# Patient Record
Sex: Male | Born: 1995 | Race: Black or African American | Hispanic: No | Marital: Single | Smoking: Never smoker
Health system: Southern US, Community
[De-identification: ages and names within clinical notes are randomized; demographics above are authoritative.]

## PROBLEM LIST (undated history)

## (undated) DIAGNOSIS — G441 Vascular headache, not elsewhere classified: Secondary | ICD-10-CM

## (undated) DIAGNOSIS — N62 Hypertrophy of breast: Secondary | ICD-10-CM

## (undated) DIAGNOSIS — G43109 Migraine with aura, not intractable, without status migrainosus: Secondary | ICD-10-CM

## (undated) DIAGNOSIS — J309 Allergic rhinitis, unspecified: Secondary | ICD-10-CM

## (undated) DIAGNOSIS — K219 Gastro-esophageal reflux disease without esophagitis: Secondary | ICD-10-CM

## (undated) HISTORY — PX: WISDOM TOOTH EXTRACTION: SHX21

## (undated) HISTORY — DX: Gastro-esophageal reflux disease without esophagitis: K21.9

## (undated) HISTORY — DX: Vascular headache, not elsewhere classified: G44.1

## (undated) HISTORY — DX: Allergic rhinitis, unspecified: J30.9

## (undated) HISTORY — DX: Migraine with aura, not intractable, without status migrainosus: G43.109

## (undated) HISTORY — DX: Hypertrophy of breast: N62

---
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
## 2013-11-20 ENCOUNTER — Other Ambulatory Visit: Payer: Self-pay | Admitting: Physician Assistant

## 2013-11-20 ENCOUNTER — Ambulatory Visit (INDEPENDENT_AMBULATORY_CARE_PROVIDER_SITE_OTHER): Payer: BC Managed Care – PPO | Admitting: Physician Assistant

## 2013-11-20 ENCOUNTER — Encounter: Payer: Self-pay | Admitting: Physician Assistant

## 2013-11-20 VITALS — BP 104/74 | HR 72 | Temp 97.8°F | Resp 18 | Wt 154.0 lb

## 2013-11-20 DIAGNOSIS — Z202 Contact with and (suspected) exposure to infections with a predominantly sexual mode of transmission: Secondary | ICD-10-CM

## 2013-11-20 DIAGNOSIS — R300 Dysuria: Secondary | ICD-10-CM

## 2013-11-20 NOTE — Progress Notes (Signed)
Pre visit review using our clinic review tool, if applicable. No additional management support is needed unless otherwise documented below in the visit note. 

## 2013-11-21 ENCOUNTER — Encounter: Payer: Self-pay | Admitting: Physician Assistant

## 2013-11-21 DIAGNOSIS — Z202 Contact with and (suspected) exposure to infections with a predominantly sexual mode of transmission: Secondary | ICD-10-CM

## 2013-11-21 DIAGNOSIS — R300 Dysuria: Secondary | ICD-10-CM

## 2013-11-21 MED ORDER — AZITHROMYCIN 250 MG PO TABS: 1000.0000 mg | ORAL_TABLET | Freq: Once | ORAL | Status: DC

## 2013-11-21 MED ORDER — CEFTRIAXONE SODIUM 1 G IJ SOLR: 250.0000 mg | Freq: Once | INTRAMUSCULAR | Status: DC

## 2013-11-21 MED ORDER — CEFTRIAXONE SODIUM 500 MG IJ SOLR
250.0000 mg | Freq: Once | INTRAMUSCULAR | Status: AC
  Administered 2013-11-21: 250 mg via INTRAMUSCULAR

## 2013-11-21 NOTE — Progress Notes (Signed)
Subjective:    Patient ID: Brandon Buckley, male    DOB: Mar 20, 1996, 18 y.o.   MRN: [id number]  HPI Patient is a 18 year old African American male presenting to the clinic for a possible urinary tract infection. Patient states that for the past 3 days he is still an itchy sensation inside his urethra, felt the urge to pee, increased frequency of having to urinate. He denies dysuria, discharge, testicular tenderness, perineal tenderness, flank pain, abdominal pain. He is sexually active with multiple partners, and does not use protection, however does not believe he has acquired an STD. He denies abnormal coloration of the urine, blood in his urine, foul smell to his urine. He denies fevers, chills, nausea, vomiting, diarrhea, shortness of breath. He has no history of UTIs, STDs, or urological procedures.   Review of Systems As per history of present illness and are otherwise negative.   History reviewed. No pertinent past medical history.  History   Social History  . Marital Status: Single    Spouse Name: N/A    Number of Children: N/A  . Years of Education: N/A   Occupational History  . Not on file.   Social History Main Topics  . Smoking status: Never Smoker   . Smokeless tobacco: Never Used  . Alcohol Use: No  . Drug Use: Not on file  . Sexual Activity: Not on file   Other Topics Concern  . Not on file   Social History Narrative   hh of 5    3 dogs    No ets.      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    History reviewed. No pertinent past surgical history.  Family History  Problem Relation Age of Onset  . Hypertension      both grandparents  . Diabetes type II      both grandparents.   . Stroke      ggm  . Migraines Mother     No Known Allergies  No current outpatient prescriptions on file prior to visit.   No current facility-administered medications on file prior to visit.    EXAM: BP  104/74  Pulse 72  Temp(Src) 97.8 F (36.6 C) (Oral)  Resp 18  Wt 154 lb (69.854 kg)      Objective:   Physical Exam  Nursing note and vitals reviewed. Constitutional: He is oriented to person, place, and time. He appears well-developed and well-nourished. No distress.  HENT:  Head: Normocephalic and atraumatic.  Eyes: Conjunctivae and EOM are normal. Pupils are equal, round, and reactive to light.  Neck: Normal range of motion. Neck supple.  Cardiovascular: Normal rate, regular rhythm, normal heart sounds and intact distal pulses.   Pulmonary/Chest: Effort normal and breath sounds normal. He exhibits no tenderness.  No cva tenderness.  Genitourinary: Penis normal. No penile tenderness.  Musculoskeletal: Normal range of motion.  Neurological: He is alert and oriented to person, place, and time.  Skin: Skin is warm and dry. No rash noted. He is not diaphoretic. No erythema. No pallor.  Psychiatric: He has a normal mood and affect. His behavior is normal. Judgment and thought content normal.   No results found for this basename: WBC, HGB, HCT, PLT, GLUCOSE, CHOL, TRIG, HDL, LDLDIRECT, LDLCALC, ALT, AST, NA, K, CL, CREATININE, BUN, CO2, TSH, PSA, INR, GLUF, HGBA1C, MICROALBUR   UA dipstick - small Leukocytes.  Assessment & Plan:  Brandon Buckley was seen today for cystitis.  Diagnoses and associated orders for this visit:  Dysuria Comments: Small leukocytes on UA. Will culture urine. - POCT urinalysis dipstick - Culture, Urine - GC/chlamydia probe amp, urine - cefTRIAXone (ROCEPHIN) injection 250 mg; Inject 0.25 g (250 mg total) into the muscle once.  Possible exposure to STD Comments: Pt unsure, no history of UTIs. Will have urine tested for gonorrhea and Chlamydia. Empirically treat with 1 g of azithromycin and 250 mg IM injection Rocephin. - Culture, Urine - GC/chlamydia probe amp, urine - azithromycin (ZITHROMAX) 250 MG tablet; Take 4 tablets (1,000 mg total) by mouth once. 2  tablets once daily for 3 consecutive days - cefTRIAXone (ROCEPHIN) injection 250 mg; Inject 0.25 g (250 mg total) into the muscle once.   Patient empirically treated for possible STD exposure with 1 g of azithromycin hand-written prescription (power loss in building), and 250 mg IM injection of Rocephin. Urine is being cultured and tested for gonorrhea and Chlamydia. No HIV reflex as patient presents with lack of exposure to HIV, and only recently developed urinary symptoms, and is otherwise asymptomatic.  Return precautions provided  Plan to follow up as needed, or for worsening or persistent symptoms despite treatment.  Patient Instructions  We will call you with your lab results when they are available.  Azithromycin, take 4 tablets all at once with a full glass of water.  Call to let office know if you can tolerate the medication, or if you vomit after taking it, as this could indicate that you were not treated.  Followup as needed, or for worsening or persistent symptoms despite treatment.

## 2013-11-21 NOTE — Patient Instructions (Addendum)
We will call you with your lab results when they are available.  Azithromycin, take 4 tablets all at once with a full glass of water.  Call to let office know if you can tolerate the medication, or if you vomit after taking it, as this could indicate that you were not treated.  Followup as needed, or for worsening or persistent symptoms despite treatment.

## 2013-11-21 NOTE — Addendum Note (Signed)
Addended by: Azucena FreedMILLNER, Jahvier Aldea C on: 11/21/2013 08:22 AM   Modules accepted: Orders

## 2013-11-22 LAB — URINE CULTURE
Colony Count: NO GROWTH
Organism ID, Bacteria: NO GROWTH

## 2013-11-23 LAB — GC/CHLAMYDIA PROBE AMP
CT Probe RNA: POSITIVE — AB
GC PROBE AMP APTIMA: NEGATIVE

## 2013-11-26 ENCOUNTER — Telehealth: Payer: Self-pay

## 2013-11-26 NOTE — Telephone Encounter (Signed)
Called and advised pt to make sure partners are aware so they can be treated.

## 2013-11-26 NOTE — Telephone Encounter (Signed)
Called and spoke with pt and pt is aware.  Pt states he was active with another partner last night.  Advised pt to make them aware of diagnosis. Is there anything else I need to make pt aware of?

## 2013-11-26 NOTE — Telephone Encounter (Signed)
Received a fax from [facility] of pt's results. There was no growth on the urine culture.  Pt tested positive for chlamydia.  Pt was treated in the office with rocephin 250 mg and azithromycin was sent to the pharmacy.  Pt should not be sexually active for at least 1 week and notify all partners of diagnosis.   Ok per Zion[place]onda to call patient and advise of results. Left a message for pt to return call.

## 2013-11-26 NOTE — Telephone Encounter (Signed)
Just be sure Health Dept has been notified.

## 2014-03-04 ENCOUNTER — Encounter: Payer: Self-pay | Admitting: Internal Medicine

## 2014-06-09 ENCOUNTER — Ambulatory Visit (INDEPENDENT_AMBULATORY_CARE_PROVIDER_SITE_OTHER): Payer: BLUE CROSS/BLUE SHIELD | Admitting: Internal Medicine

## 2014-06-09 ENCOUNTER — Encounter: Payer: Self-pay | Admitting: Internal Medicine

## 2014-06-09 VITALS — BP 142/82 | Temp 98.0°F | Ht 70.0 in | Wt 152.4 lb

## 2014-06-09 DIAGNOSIS — M545 Low back pain, unspecified: Secondary | ICD-10-CM

## 2014-06-09 DIAGNOSIS — R030 Elevated blood-pressure reading, without diagnosis of hypertension: Secondary | ICD-10-CM

## 2014-06-09 DIAGNOSIS — Z299 Encounter for prophylactic measures, unspecified: Secondary | ICD-10-CM

## 2014-06-09 DIAGNOSIS — Z113 Encounter for screening for infections with a predominantly sexual mode of transmission: Secondary | ICD-10-CM

## 2014-06-09 DIAGNOSIS — Z418 Encounter for other procedures for purposes other than remedying health state: Secondary | ICD-10-CM

## 2014-06-09 LAB — POCT URINALYSIS DIPSTICK
Glucose, UA: NEGATIVE
Ketones, UA: NEGATIVE
Leukocytes, UA: NEGATIVE
Nitrite, UA: NEGATIVE
Spec Grav, UA: 1.025
UROBILINOGEN UA: 1
pH, UA: 7

## 2014-06-09 NOTE — Patient Instructions (Signed)
Back pain seems like mechanichal back pain   Back exercises and if  persistent or progressive would advise seeing sports medicine .  Advise blood test as discussed to include cholesterol blood sugar anemia check HIV   Etc .   Back Exercises Back exercises help treat and prevent back injuries. The goal of back exercises is to increase the strength of your abdominal and back muscles and the flexibility of your back. These exercises should be started when you no longer have back pain. Back exercises include:  Pelvic Tilt. Lie on your back with your knees bent. Tilt your pelvis until the lower part of your back is against the floor. Hold this position 5 to 10 sec and repeat 5 to 10 times.  Knee to Chest. Pull first 1 knee up against your chest and hold for 20 to 30 seconds, repeat this with the other knee, and then both knees. This may be done with the other leg straight or bent, whichever feels better.  Sit-Ups or Curl-Ups. Bend your knees 90 degrees. Start with tilting your pelvis, and do a partial, slow sit-up, lifting your trunk only 30 to 45 degrees off the floor. Take at least 2 to 3 seconds for each sit-up. Do not do sit-ups with your knees out straight. If partial sit-ups are difficult, simply do the above but with only tightening your abdominal muscles and holding it as directed.  Hip-Lift. Lie on your back with your knees flexed 90 degrees. Push down with your feet and shoulders as you raise your hips a couple inches off the floor; hold for 10 seconds, repeat 5 to 10 times.  Back arches. Lie on your stomach, propping yourself up on bent elbows. Slowly press on your hands, causing an arch in your low back. Repeat 3 to 5 times. Any initial stiffness and discomfort should lessen with repetition over time.  Shoulder-Lifts. Lie face down with arms beside your body. Keep hips and torso pressed to floor as you slowly lift your head and shoulders off the floor. Do not overdo your exercises,  especially in the beginning. Exercises may cause you some mild back discomfort which lasts for a few minutes; however, if the pain is more severe, or lasts for more than 15 minutes, do not continue exercises until you see your caregiver. Improvement with exercise therapy for back problems is slow.  See your caregivers for assistance with developing a proper back exercise program. Document Released: 06/30/2004 Document Revised: 08/15/2011 Document Reviewed: 03/24/2011 [facility] Patient Information 2015 [place], [place]. This information is not intended to replace advice given to you by your health care provider. Make sure you discuss any questions you have with your health care provider.

## 2014-06-09 NOTE — Progress Notes (Signed)
Pre visit review using our clinic review tool, if applicable. No additional management support is needed unless otherwise documented below in the visit note.  Chief Complaint  Patient presents with  . Lower Back Pain    On and off since June.    HPI: Patient Brandon Buckley  comes in today for SDA for  new problem evaluation. Last seem by me 3 years ago  And PA last year and had chlamydia  With lbp. lbp  Middle  And off and on  4 months no injury  Worse with sitting too long and  Night.   Every day sharp at times   Certain motions bend over.     No meds    And no dysuria  Otherwise  .  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 No remote hx  No hx of ht  No gi sx  No fever chills resp sx  No rd weekend  Etoh.  No past medical history on file.  Family History  Problem Relation Age of Onset  . Hypertension      both grandparents  . Diabetes type II      both grandparents.   . Stroke      ggm  . Migraines Mother     History   Social History  . Marital Status: Single    Spouse Name: N/A    Number of Children: N/A  . Years of Education: N/A   Social History Main Topics  . Smoking status: Never Smoker   . Smokeless tobacco: Never Used  . Alcohol Use: No  . Drug Use: None  . Sexual Activity: None   Other Topics Concern  . None   Social History Narrative   hh of 5    3 dogs    No ets.      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    Outpatient Encounter Prescriptions as of 06/09/2014  Medication Sig  . [DISCONTINUED] azithromycin (ZITHROMAX) 250 MG tablet Take 4 tablets (1,000 mg total) by mouth once. 2 tablets once daily for 3 consecutive days    EXAM:  BP 142/82 mmHg  Temp(Src) 98 F (36.7 C) (Oral)  Ht  (1.778 m)  Wt 152 lb 6.4 oz (69.128 kg)  BMI 21.87 kg/m2  Body mass index is 21.87 kg/(m^2).  GENERAL: vitals reviewed and listed above,  alert, oriented, appears well hydrated and in no acute distress HEENT: atraumatic, conjunctiva  clear, no obvious abnormalities on inspection of external nose and ears OP : no lesion edema or exudate  NECK: no obvious masses on inspection palpation  LUNGS: clear to auscultation bilaterally, no wheezes, rales or rhonchi, good air movement CV: HRRR, no clubbing cyanosis or  peripheral edema nl cap refill  Abdomen:  Sof,t normal bowel sounds without hepatosplenomegaly, no guarding rebound or masses no CVA tenderness Back no scoilosis and no focal pain points to mid lower back ls area  Neg lsr no weakness  no rash.  MS: moves all extremities without noticeable focal  abnormality PSYCH: pleasant and cooperative, no obvious depression or anxiety  ASSESSMENT AND PLAN:  Discussed the following assessment and plan:  Midline low back pain without sciatica - seems postural no alarm  sx except young age  - Plan: GC/chlamydia probe amp, urine, POCT urinalysis dipstick, Basic metabolic panel,  CBC with Differential, Hepatic function panel, Lipid panel, TSH, HIV antibody, RPR  Routine screening for STI (sexually transmitted infection) - hx chl  never had  hiv rpr  advise this he wants to check on cost first - Plan: GC/chlamydia probe amp, urine, POCT urinalysis dipstick, Basic metabolic panel, CBC with Differential, Hepatic function panel, Lipid panel, TSH, HIV antibody, RPR  Elevated blood pressure reading - 142/84  repeat to ensure normal x 3  if not recheck pt says had stress before coming in   Preventive measure - Plan: Basic metabolic panel, CBC with Differential, Hepatic function panel, Lipid panel, TSH, HIV antibody, RPR Hx of chlamydia  And rx  Some type of back pain then but doesn't. sound like this.    Wants to wait on blood test will ask parents about cost etc can make appt any time for this    Need to ensure BP is not  Consistently elevated -Patient advised to return or notify health care team  if  symptoms worsen ,persist or new concerns arise.  Ho given x 2  further eval if  persistent or progressive   Patient Instructions  Back pain seems like mechanichal back pain   Back exercises and if  persistent or progressive would advise seeing sports medicine .  Advise blood test as discussed to include cholesterol blood sugar anemia check HIV   Etc .   Back Exercises Back exercises help treat and prevent back injuries. The goal of back exercises is to increase the strength of your abdominal and back muscles and the flexibility of your back. These exercises should be started when you no longer have back pain. Back exercises include:  Pelvic Tilt. Lie on your back with your knees bent. Tilt your pelvis until the lower part of your back is against the floor. Hold this position 5 to 10 sec and repeat 5 to 10 times.  Knee to Chest. Pull first 1 knee up against your chest and hold for 20 to 30 seconds, repeat this with the other knee, and then both knees. This may be done with the other leg straight or bent, whichever feels better.  Sit-Ups or Curl-Ups. Bend your knees 90 degrees. Start with tilting your pelvis, and do a partial, slow sit-up, lifting your trunk only 30 to 45 degrees off the floor. Take at least 2 to 3 seconds for each sit-up. Do not do sit-ups with your knees out straight. If partial sit-ups are difficult, simply do the above but with only tightening your abdominal muscles and holding it as directed.  Hip-Lift. Lie on your back with your knees flexed 90 degrees. Push down with your feet and shoulders as you raise your hips a couple inches off the floor; hold for 10 seconds, repeat 5 to 10 times.  Back arches. Lie on your stomach, propping yourself up on bent elbows. Slowly press on your hands, causing an arch in your low back. Repeat 3 to 5 times. Any initial stiffness and discomfort should lessen with repetition over time.  Shoulder-Lifts. Lie face down with arms beside your  body. Keep hips and torso pressed to floor as you slowly lift your head and shoulders off the floor. Do not overdo your exercises, especially in the beginning. Exercises may cause you some mild back discomfort which lasts for a few minutes; however, if the pain is more severe, or lasts for more than 15 minutes, do not continue exercises until you see your caregiver. Improvement with exercise therapy for  back problems is slow.  See your caregivers for assistance with developing a proper back exercise program. Document Released: 06/30/2004 Document Revised: 08/15/2011 Document Reviewed: 03/24/2011 [facility] Patient Information 2015 [place], [place]. This information is not intended to replace advice given to you by your health care provider. Make sure you discuss any questions you have with your health care provider.      Neta Mends. Aeric Burnham M.D. Total visit > 50% spent counseling and coordinating care

## 2014-06-14 LAB — GC/CHLAMYDIA PROBE AMP, URINE
Chlamydia, Swab/Urine, PCR: NEGATIVE
GC PROBE AMP, URINE: NEGATIVE

## 2014-07-15 ENCOUNTER — Ambulatory Visit (INDEPENDENT_AMBULATORY_CARE_PROVIDER_SITE_OTHER): Payer: BLUE CROSS/BLUE SHIELD | Admitting: Internal Medicine

## 2014-07-15 ENCOUNTER — Encounter: Payer: Self-pay | Admitting: Internal Medicine

## 2014-07-15 VITALS — BP 158/80 | Temp 98.0°F | Ht 70.0 in | Wt 154.7 lb

## 2014-07-15 DIAGNOSIS — M545 Low back pain, unspecified: Secondary | ICD-10-CM

## 2014-07-15 DIAGNOSIS — Z418 Encounter for other procedures for purposes other than remedying health state: Secondary | ICD-10-CM

## 2014-07-15 DIAGNOSIS — R030 Elevated blood-pressure reading, without diagnosis of hypertension: Secondary | ICD-10-CM

## 2014-07-15 DIAGNOSIS — Z299 Encounter for prophylactic measures, unspecified: Secondary | ICD-10-CM

## 2014-07-15 LAB — POCT URINALYSIS DIP (MANUAL ENTRY)
Bilirubin, UA: NEGATIVE
Blood, UA: NEGATIVE
GLUCOSE UA: NEGATIVE
Leukocytes, UA: NEGATIVE
NITRITE UA: NEGATIVE
PROTEIN UA: NEGATIVE
Spec Grav, UA: 1.01
Urobilinogen, UA: 0.2
pH, UA: 6

## 2014-07-15 LAB — HEPATIC FUNCTION PANEL
ALBUMIN: 4.4 g/dL (ref 3.5–5.2)
ALK PHOS: 76 U/L (ref 39–117)
ALT: 8 U/L (ref 0–53)
AST: 15 U/L (ref 0–37)
Bilirubin, Direct: 0.3 mg/dL (ref 0.0–0.3)
Total Bilirubin: 1.4 mg/dL — ABNORMAL HIGH (ref 0.2–1.2)
Total Protein: 7.1 g/dL (ref 6.0–8.3)

## 2014-07-15 LAB — BASIC METABOLIC PANEL
BUN: 11 mg/dL (ref 6–23)
CALCIUM: 9.6 mg/dL (ref 8.4–10.5)
CO2: 29 mEq/L (ref 19–32)
CREATININE: 0.8 mg/dL (ref 0.40–1.50)
Chloride: 103 mEq/L (ref 96–112)
GFR: 157.25 mL/min (ref >60.00)
Glucose, Bld: 89 mg/dL (ref 70–99)
Potassium: 4.2 mEq/L (ref 3.5–5.1)
SODIUM: 137 meq/L (ref 135–145)

## 2014-07-15 LAB — CBC WITH DIFFERENTIAL/PLATELET
Basophils Absolute: 0 10*3/uL (ref 0.0–0.1)
Basophils Relative: 0.5 % (ref 0.0–3.0)
Eosinophils Absolute: 0.1 10*3/uL (ref 0.0–0.7)
Eosinophils Relative: 1.3 % (ref 0.0–5.0)
HCT: 42.9 % (ref 39.0–52.0)
Hemoglobin: 14.8 g/dL (ref 13.0–17.0)
LYMPHS PCT: 36.6 % (ref 12.0–46.0)
Lymphs Abs: 2.2 10*3/uL (ref 0.7–4.0)
MCHC: 34.5 g/dL (ref 30.0–36.0)
MCV: 93.4 fl (ref 78.0–100.0)
MONOS PCT: 5.6 % (ref 3.0–12.0)
Monocytes Absolute: 0.3 10*3/uL (ref 0.1–1.0)
NEUTROS ABS: 3.3 10*3/uL (ref 1.4–7.7)
Neutrophils Relative %: 56 % (ref 43.0–77.0)
Platelets: 274 10*3/uL (ref 150.0–400.0)
RBC: 4.59 Mil/uL (ref 4.22–5.81)
RDW: 13.1 % (ref 11.5–14.6)
WBC: 6 10*3/uL (ref 4.5–10.5)

## 2014-07-15 LAB — LIPID PANEL
CHOL/HDL RATIO: 2
CHOLESTEROL: 144 mg/dL (ref 0–200)
HDL: 65.4 mg/dL (ref >39.00)
LDL CALC: 67 mg/dL (ref 0–99)
NonHDL: 78.6
TRIGLYCERIDES: 58 mg/dL (ref 0.0–149.0)
VLDL: 11.6 mg/dL (ref 0.0–40.0)

## 2014-07-15 LAB — C-REACTIVE PROTEIN

## 2014-07-15 LAB — TSH: TSH: 0.88 u[IU]/mL (ref 0.35–5.50)

## 2014-07-15 LAB — SEDIMENTATION RATE: Sed Rate: 6 mm/hr (ref 0–22)

## 2014-07-15 NOTE — Progress Notes (Signed)
Pre visit review using our clinic review tool, if applicable. No additional management support is needed unless otherwise documented below in the visit note.  Chief Complaint  Patient presents with  . Follow-up    basck pain persists    HPI: Brandon Buckley comes in cause back pain not better very localized but sig pain when tries to exercise jump run or other and when gets up from sitting . No fever gu sx . No sti sx  No tobacco sig etoh except super bowl sunday  fam mom  Med for bp  No cv pulm sx . ROS: See pertinent positives and negatives per HPI. No cp sob   Neg rx except MJ   Past Medical History  Diagnosis Date  . 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 Vascular headache 10/11/2011  . ALLERGIC RHINITIS 03/06/2009    Qualifier: Diagnosis of  By: Clent Ridges MD, Tera Mater   . GYNECOMASTIA 03/06/2009    Qualifier: Diagnosis of  By: Clent Ridges MD, Tera Mater     Family History  Problem Relation Age of Onset  . Hypertension      both grandparents  . Diabetes type II      both grandparents.   . Stroke      ggm  . Migraines Mother     History   Social History  . Marital Status: Single    Spouse Name: N/A    Number of Children: N/A  . Years of Education: N/A   Social History Main Topics  . Smoking status: Never Smoker   . Smokeless tobacco: Never Used  . Alcohol Use: No  . Drug Use: None  . Sexual Activity: None   Other Topics Concern  . None   Social History Narrative   hh of 5    3 dogs    No ets.      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    EXAM:  BP 158/80 mmHg  Temp(Src) 98 F (36.7 C) (Oral)  Ht  (1.778 m)  Wt 154 lb 11.2 oz (70.171 kg)  BMI 22.20 kg/m2  Body mass index is 22.2 kg/(m^2).  GENERAL: vitals reviewed and listed above, alert, oriented, appears well hydrated and in no acute distress HEENT: atraumatic, conjunctiva  clear, no  obvious abnormalities on inspection of external nose and ears  NECK: no obvious masses on inspection palpation  CV: HRRR, no clubbing cyanosis or  peripheral edema nl cap refill  Nl rom back  tender  Mid sacral lumbar area as area of  Discomfort  No lesions MS: moves all extremities without noticeable focal  abnormality PSYCH: pleasant and cooperative, no obvious depression or anxiety Lab Results  Component Value Date   WBC 6.0 07/15/2014   HGB 14.8 07/15/2014   HCT 42.9 07/15/2014   PLT 274.0 07/15/2014   GLUCOSE 89 07/15/2014   CHOL 144 07/15/2014   TRIG 58.0 07/15/2014   HDL 65.40 07/15/2014   LDLCALC 67 07/15/2014   ALT 8 07/15/2014   AST 15 07/15/2014   NA 137 07/15/2014   K 4.2 07/15/2014   CL 103 07/15/2014   CREATININE 0.80 07/15/2014   BUN 11 07/15/2014   CO2 29 07/15/2014   TSH 0.88 07/15/2014    ASSESSMENT AND PLAN:  Discussed the following assessment and plan:  Midline low  back pain without sciatica - persisten local interfereing with ability to run or exercise refer to sm more evaluation imaging as indicated  - Plan: POCT urinalysis dipstick, Basic metabolic panel, CBC with Differential/Platelet, Hepatic function panel, Lipid panel, TSH, HIV antibody, C-reactive protein, Sedimentation rate, Ambulatory referral to Sports Medicine  Elevated blood pressure reading - concern about level of elevation needs fu to ensure control or rx  rov in 1 month ORsend in readings /lab today - Plan: POCT urinalysis dipstick, Basic metabolic panel, CBC with Differential/Platelet, Hepatic function panel, Lipid panel, TSH, HIV antibody, C-reactive protein, Sedimentation rate  Preventive measure - labs and screening since blood draw today - Plan: POCT urinalysis dipstick, Basic metabolic panel, CBC with Differential/Platelet, Hepatic function panel, Lipid panel, TSH, HIV antibody, C-reactive protein, Sedimentation rate BP Readings from Last 3 Encounters:  07/15/14 158/80  06/09/14 142/82   11/20/13 104/74   Total visit > 50% spent counseling and coordinating care   -Patient advised to return or notify health care team  if symptoms worsen ,persist or new concerns arise.  Patient Instructions   Plan referral to sports medicine for  your back pain .  You will need x ray of the area  Change appt time if need to .  Will notify you  of labs when available. Checking for kidney dysfunction inflammation and routine screening . Your BP is quite high for your age group .  Again could be from  Stress of visit but  Need to document  Over time .  Hypertension is 3 separate readings elevated  for age .  Yoiurs should be in the 120/80 range . If doesn't come down plan treatment  .    Hypertension Hypertension, commonly called high blood pressure, is when the force of blood pumping through your arteries is too strong. Your arteries are the blood vessels that carry blood from your heart throughout your body. A blood pressure reading consists of a higher number over a lower number, such as 110/72. The higher number (systolic) is the pressure inside your arteries when your heart pumps. The lower number (diastolic) is the pressure inside your arteries when your heart relaxes. Ideally you want your blood pressure below 120/80. Hypertension forces your heart to work harder to pump blood. Your arteries may become narrow or stiff. Having hypertension puts you at risk for heart disease, stroke, and other problems.  RISK FACTORS Some risk factors for high blood pressure are controllable. Others are not.  Risk factors you cannot control include:   Race. You may be at higher risk if you are African American.  Age. Risk increases with age.  Gender. Men are at higher risk than women before age 73 years. After age 24, women are at higher risk than men. Risk factors you can control include:  Not getting enough exercise or physical activity.  Being overweight.  Getting too much fat, sugar,  calories, or salt in your diet.  Drinking too much alcohol. SIGNS AND SYMPTOMS Hypertension does not usually cause signs or symptoms. Extremely high blood pressure (hypertensive crisis) may cause headache, anxiety, shortness of breath, and nosebleed. DIAGNOSIS  To check if you have hypertension, your health care provider will measure your blood pressure while you are seated, with your arm held at the level of your heart. It should be measured at least twice using the same arm. Certain conditions can cause a difference in blood pressure between your right and left arms. A blood pressure reading that is  higher than normal on one occasion does not mean that you need treatment. If one blood pressure reading is high, ask your health care provider about having it checked again. TREATMENT  Treating high blood pressure includes making lifestyle changes and possibly taking medicine. Living a healthy lifestyle can help lower high blood pressure. You may need to change some of your habits. Lifestyle changes may include:  Following the DASH diet. This diet is high in fruits, vegetables, and whole grains. It is low in salt, red meat, and added sugars.  Getting at least 2 hours of brisk physical activity every week.  Losing weight if necessary.  Not smoking.  Limiting alcoholic beverages.  Learning ways to reduce stress. If lifestyle changes are not enough to get your blood pressure under control, your health care provider may prescribe medicine. You may need to take more than one. Work closely with your health care provider to understand the risks and benefits. HOME CARE INSTRUCTIONS  Have your blood pressure rechecked as directed by your health care provider.   Take medicines only as directed by your health care provider. Follow the directions carefully. Blood pressure medicines must be taken as prescribed. The medicine does not work as well when you skip doses. Skipping doses also puts you at risk  for problems.   Do not smoke.   Monitor your blood pressure at home as directed by your health care provider. SEEK MEDICAL CARE IF:   You think you are having a reaction to medicines taken.  You have recurrent headaches or feel dizzy.  You have swelling in your ankles.  You have trouble with your vision. SEEK IMMEDIATE MEDICAL CARE IF:  You develop a severe headache or confusion.  You have unusual weakness, numbness, or feel faint.  You have severe chest or abdominal pain.  You vomit repeatedly.  You have trouble breathing. MAKE SURE YOU:   Understand these instructions.  Will watch your condition.  Will get help right away if you are not doing well or get worse. Document Released: 05/23/2005 Document Revised: 10/07/2013 Document Reviewed: 03/15/2013 [facility] Patient Information 2015 [place], [place]. This information is not intended to replace advice given to you by your health care provider. Make sure you discuss any questions you have with your health care provider. How to Take Your Blood Pressure HOW DO I GET A BLOOD PRESSURE MACHINE?  You can buy an electronic home blood pressure machine at your local pharmacy. Insurance will sometimes cover the cost if you have a prescription.  Ask your doctor what type of machine is best for you. There are different machines for your arm and your wrist.  If you decide to buy a machine to check your blood pressure on your arm, first check the size of your arm so you can buy the right size cuff. To check the size of your arm:   Use a measuring tape that shows both inches and centimeters.   Wrap the measuring tape around the upper-middle part of your arm. You may need someone to help you measure.   Write down your arm measurement in both inches and centimeters.   To measure your blood pressure correctly, it is important to have the right size cuff.   If your arm is up to 13 inches (up to 34 centimeters), get an adult cuff  size.  If your arm is 13 to 17 inches (35 to 44 centimeters), get a large adult cuff size.    If your arm is  17 to 20 inches (45 to 52 centimeters), get an adult thigh cuff.  WHAT DO THE NUMBERS MEAN?   There are two numbers that make up your blood pressure. For example: 120/80.  The first number (120 in our example) is called the "systolic pressure." It is a measure of the pressure in your blood vessels when your heart is pumping blood.  The second number (80 in our example) is called the "diastolic pressure." It is a measure of the pressure in your blood vessels when your heart is resting between beats.  Your doctor will tell you what your blood pressure should be. WHAT SHOULD I DO BEFORE I CHECK MY BLOOD PRESSURE?   Try to rest or relax for at least 30 minutes before you check your blood pressure.  Do not smoke.  Do not have any drinks with caffeine, such as:  Soda.  Coffee.  Tea.  Check your blood pressure in a quiet room.  Sit down and stretch out your arm on a table. Keep your arm at about the level of your heart. Let your arm relax.  Make sure that your legs are not crossed. HOW DO I CHECK MY BLOOD PRESSURE?  Follow the directions that came with your machine.  Make sure you remove any tight-fighting clothing from your arm or wrist. Wrap the cuff around your upper arm or wrist. You should be able to fit a finger between the cuff and your arm. If you cannot fit a finger between the cuff and your arm, it is too tight and should be removed and rewrapped.  Some units require you to manually pump up the arm cuff.  Automatic units inflate the cuff when you press a button.  Cuff deflation is automatic in both models.  After the cuff is inflated, the unit measures your blood pressure and pulse. The readings are shown on a monitor. Hold still and breathe normally while the cuff is inflated.  Getting a reading takes less than a minute.  Some models store readings in a  memory. Some provide a printout of readings. If your machine does not store your readings, keep a written record.  Take readings with you to your next visit with your doctor. Document Released: 05/05/2008 Document Revised: 10/07/2013 Document Reviewed: 07/18/2013 [facility] Patient Information 2015 [place], [place]. This information is not intended to replace advice given to you by your health care provider. Make sure you discuss any questions you have with your health care provider.        Neta MendsWanda K. Nazirah Tri M.D.

## 2014-07-15 NOTE — Patient Instructions (Addendum)
Plan referral to sports medicine for  your back pain .  You will need x ray of the area  Change appt time if need to .  Will notify you  of labs when available. Checking for kidney dysfunction inflammation and routine screening . Your BP is quite high for your age group .  Again could be from  Stress of visit but  Need to document  Over time .  Hypertension is 3 separate readings elevated  for age .  Yoiurs should be in the 120/80 range . If doesn't come down plan treatment  .    Hypertension Hypertension, commonly called high blood pressure, is when the force of blood pumping through your arteries is too strong. Your arteries are the blood vessels that carry blood from your heart throughout your body. A blood pressure reading consists of a higher number over a lower number, such as 110/72. The higher number (systolic) is the pressure inside your arteries when your heart pumps. The lower number (diastolic) is the pressure inside your arteries when your heart relaxes. Ideally you want your blood pressure below 120/80. Hypertension forces your heart to work harder to pump blood. Your arteries may become narrow or stiff. Having hypertension puts you at risk for heart disease, stroke, and other problems.  RISK FACTORS Some risk factors for high blood pressure are controllable. Others are not.  Risk factors you cannot control include:   Race. You may be at higher risk if you are African American.  Age. Risk increases with age.  Gender. Men are at higher risk than women before age 91 years. After age 25, women are at higher risk than men. Risk factors you can control include:  Not getting enough exercise or physical activity.  Being overweight.  Getting too much fat, sugar, calories, or salt in your diet.  Drinking too much alcohol. SIGNS AND SYMPTOMS Hypertension does not usually cause signs or symptoms. Extremely high blood pressure (hypertensive crisis) may cause headache, anxiety,  shortness of breath, and nosebleed. DIAGNOSIS  To check if you have hypertension, your health care provider will measure your blood pressure while you are seated, with your arm held at the level of your heart. It should be measured at least twice using the same arm. Certain conditions can cause a difference in blood pressure between your right and left arms. A blood pressure reading that is higher than normal on one occasion does not mean that you need treatment. If one blood pressure reading is high, ask your health care provider about having it checked again. TREATMENT  Treating high blood pressure includes making lifestyle changes and possibly taking medicine. Living a healthy lifestyle can help lower high blood pressure. You may need to change some of your habits. Lifestyle changes may include:  Following the DASH diet. This diet is high in fruits, vegetables, and whole grains. It is low in salt, red meat, and added sugars.  Getting at least 2 hours of brisk physical activity every week.  Losing weight if necessary.  Not smoking.  Limiting alcoholic beverages.  Learning ways to reduce stress. If lifestyle changes are not enough to get your blood pressure under control, your health care provider may prescribe medicine. You may need to take more than one. Work closely with your health care provider to understand the risks and benefits. HOME CARE INSTRUCTIONS  Have your blood pressure rechecked as directed by your health care provider.   Take medicines only as directed by your health  care provider. Follow the directions carefully. Blood pressure medicines must be taken as prescribed. The medicine does not work as well when you skip doses. Skipping doses also puts you at risk for problems.   Do not smoke.   Monitor your blood pressure at home as directed by your health care provider. SEEK MEDICAL CARE IF:   You think you are having a reaction to medicines taken.  You have  recurrent headaches or feel dizzy.  You have swelling in your ankles.  You have trouble with your vision. SEEK IMMEDIATE MEDICAL CARE IF:  You develop a severe headache or confusion.  You have unusual weakness, numbness, or feel faint.  You have severe chest or abdominal pain.  You vomit repeatedly.  You have trouble breathing. MAKE SURE YOU:   Understand these instructions.  Will watch your condition.  Will get help right away if you are not doing well or get worse. Document Released: 05/23/2005 Document Revised: 10/07/2013 Document Reviewed: 03/15/2013 [facility] Patient Information 2015 [place], [place]. This information is not intended to replace advice given to you by your health care provider. Make sure you discuss any questions you have with your health care provider. How to Take Your Blood Pressure HOW DO I GET A BLOOD PRESSURE MACHINE?  You can buy an electronic home blood pressure machine at your local pharmacy. Insurance will sometimes cover the cost if you have a prescription.  Ask your doctor what type of machine is best for you. There are different machines for your arm and your wrist.  If you decide to buy a machine to check your blood pressure on your arm, first check the size of your arm so you can buy the right size cuff. To check the size of your arm:   Use a measuring tape that shows both inches and centimeters.   Wrap the measuring tape around the upper-middle part of your arm. You may need someone to help you measure.   Write down your arm measurement in both inches and centimeters.   To measure your blood pressure correctly, it is important to have the right size cuff.   If your arm is up to 13 inches (up to 34 centimeters), get an adult cuff size.  If your arm is 13 to 17 inches (35 to 44 centimeters), get a large adult cuff size.    If your arm is 17 to 20 inches (45 to 52 centimeters), get an adult thigh cuff.  WHAT DO THE NUMBERS MEAN?    There are two numbers that make up your blood pressure. For example: 120/80.  The first number (120 in our example) is called the "systolic pressure." It is a measure of the pressure in your blood vessels when your heart is pumping blood.  The second number (80 in our example) is called the "diastolic pressure." It is a measure of the pressure in your blood vessels when your heart is resting between beats.  Your doctor will tell you what your blood pressure should be. WHAT SHOULD I DO BEFORE I CHECK MY BLOOD PRESSURE?   Try to rest or relax for at least 30 minutes before you check your blood pressure.  Do not smoke.  Do not have any drinks with caffeine, such as:  Soda.  Coffee.  Tea.  Check your blood pressure in a quiet room.  Sit down and stretch out your arm on a table. Keep your arm at about the level of your heart. Let your arm relax.  Make sure that your legs are not crossed. HOW DO I CHECK MY BLOOD PRESSURE?  Follow the directions that came with your machine.  Make sure you remove any tight-fighting clothing from your arm or wrist. Wrap the cuff around your upper arm or wrist. You should be able to fit a finger between the cuff and your arm. If you cannot fit a finger between the cuff and your arm, it is too tight and should be removed and rewrapped.  Some units require you to manually pump up the arm cuff.  Automatic units inflate the cuff when you press a button.  Cuff deflation is automatic in both models.  After the cuff is inflated, the unit measures your blood pressure and pulse. The readings are shown on a monitor. Hold still and breathe normally while the cuff is inflated.  Getting a reading takes less than a minute.  Some models store readings in a memory. Some provide a printout of readings. If your machine does not store your readings, keep a written record.  Take readings with you to your next visit with your doctor. Document Released: 05/05/2008  Document Revised: 10/07/2013 Document Reviewed: 07/18/2013 [facility] Patient Information 2015 [place], [place]. This information is not intended to replace advice given to you by your health care provider. Make sure you discuss any questions you have with your health care provider.

## 2014-07-16 ENCOUNTER — Telehealth: Payer: Self-pay | Admitting: Family Medicine

## 2014-07-16 LAB — HIV ANTIBODY (ROUTINE TESTING W REFLEX): HIV 1&2 Ab, 4th Generation: NONREACTIVE

## 2014-07-16 NOTE — Telephone Encounter (Signed)
Pt is scheduled for 08/12/14 for follow up.

## 2014-07-16 NOTE — Telephone Encounter (Signed)
-----   Message from Madelin HeadingsWanda K Panosh, MD sent at 07/15/2014  5:59 PM EST ----- Regarding: make sure fu BP Misty   Please make sure he either   Comes back for ROV BP readings   OR sends the readings in and make sure they are note hypertensive. He is at [employer]  Told him could see if health center does BP check and ask for Padonda  If she is there.   WP

## 2014-07-17 ENCOUNTER — Encounter: Payer: Self-pay | Admitting: Family Medicine

## 2014-07-23 ENCOUNTER — Ambulatory Visit (INDEPENDENT_AMBULATORY_CARE_PROVIDER_SITE_OTHER)
Admission: RE | Admit: 2014-07-23 | Discharge: 2014-07-23 | Disposition: A | Discharge status: Home / Self Care | Payer: BLUE CROSS/BLUE SHIELD | Source: Ambulatory Visit | Attending: Family Medicine | Admitting: Family Medicine

## 2014-07-23 ENCOUNTER — Ambulatory Visit (INDEPENDENT_AMBULATORY_CARE_PROVIDER_SITE_OTHER): Payer: BLUE CROSS/BLUE SHIELD | Admitting: Family Medicine

## 2014-07-23 ENCOUNTER — Encounter: Payer: Self-pay | Admitting: Family Medicine

## 2014-07-23 VITALS — BP 126/74 | HR 77 | Ht 70.0 in | Wt 156.0 lb

## 2014-07-23 DIAGNOSIS — M357 Hypermobility syndrome: Secondary | ICD-10-CM | POA: Insufficient documentation

## 2014-07-23 DIAGNOSIS — Q796 Ehlers-Danlos syndrome: Secondary | ICD-10-CM

## 2014-07-23 DIAGNOSIS — M545 Low back pain, unspecified: Secondary | ICD-10-CM

## 2014-07-23 DIAGNOSIS — M5416 Radiculopathy, lumbar region: Secondary | ICD-10-CM

## 2014-07-23 NOTE — Assessment & Plan Note (Signed)
Patient low back pain seems to be more multifactorial and I think is secondary to some tight hip flexors. 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 We discussed icing protocol, home exercises and over-the-counter medications a could be beneficial. Patient given a trial of oral anti-inflammatories. Patient will come back and see me again in 3 weeks. Continuing to have pain I do think that further evaluation with either advanced imaging or possibly osteopathic medication. X-rays ordered today due to this pain being greater than 6 months.

## 2014-07-23 NOTE — Progress Notes (Signed)
Pre visit review using our clinic review tool, if applicable. No additional management support is needed unless otherwise documented below in the visit note. 

## 2014-07-23 NOTE — Progress Notes (Signed)
  Tawana ScaleZach Kloe Oates D.O. [facility] [address] [place], [place] [address] Phone: [phone] Subjective:    I'm seeing this patient by the request  of:  Lorretta HarpPANOSH,WANDA KOTVAN, MD   CC: low back pain  BJY:NWGNFAOZHYHPI:Subjective Brandon BeardKeylan O Buckley is a 19 y.o. male coming in with complaint of mid and low back pain. Patient has had persistent local pain that seem to interfere with his ability to run or exercise. Patient was seen by primary care provider and was sent here for further evaluation. Patient states it is intermittent, worse with increasing activity.  Patient states that it is more of a dull throbbing aching pain that seems to be midline of the lower back. Denies any radiation down the legs. Denies any numbness or detailing. Denies any weakness. Patient states that he can do his daily activities without any significant discomfort. Patient rates the severity of pain when it occurs only 710 and can keep him from doing other activity. Patient states that when he flexes it seems to be more beneficial. Patient does not remember any true injury. Denies any nighttime awakenings and denies any fevers, chills, or any abnormal findings.     Past medical history, social, surgical and family history all reviewed in electronic medical record.   Review of Systems: No headache, visual changes, nausea, vomiting, diarrhea, constipation, dizziness, abdominal pain, skin rash, fevers, chills, night sweats, weight loss, swollen lymph nodes, body aches, joint swelling, muscle aches, chest pain, shortness of breath, mood changes.   Objective Blood pressure 126/74, pulse 77, height 5\' 10"  (1.778 m), weight 156 lb (70.761 kg), SpO2 99 %.  General: No apparent distress alert and oriented x3 mood and affect normal, dressed appropriately.  HEENT: Pupils equal, extraocular movements intact  Respiratory: Patient's speak in full sentences and does not appear short of breath  Cardiovascular: No lower extremity edema, non  tender, no erythema  Skin: Warm dry intact with no signs of infection or rash on extremities or on axial skeleton.  Abdomen: Soft nontender  Neuro: Cranial nerves II through XII are intact, neurovascularly intact in all extremities with 2+ DTRs and 2+ pulses.  Lymph: No lymphadenopathy of posterior or anterior cervical chain or axillae bilaterally.  Gait normal with good balance and coordination.  MSK:  Non tender with full range of motion and good stability and symmetric strength and tone of shoulders, elbows, wrist, hip, knee and ankles bilaterally. Mild increase in laxity of all joints. Back Exam:  Inspection: Unremarkable  Motion: Flexion 45 deg, Extension 45 deg, Side Bending to 45 deg bilaterally,  Rotation to 45 deg bilaterally  SLR laying: Negative  XSLR laying: Negative  Palpable tenderness: None. FABER: negative. Sensory change: Gross sensation intact to all lumbar and sacral dermatomes.  Reflexes: 2+ at both patellar tendons, 2+ at achilles tendons, Babinski's downgoing.  Strength at foot  Plantar-flexion: 5/5 Dorsi-flexion: 5/5 Eversion: 5/5 Inversion: 5/5  Leg strength  Quad: 5/5 Hamstring: 5/5 Hip flexor: 5/5 Hip abductors: 5/5  Gait unremarkable.  Procedure note 97110; 15 minutes spent for Therapeutic exercises as stated in above notes.  This included exercises focusing on stretching, strengthening, with significant focus on eccentric aspects.   Proper technique shown and discussed handout in great detail with ATC.  All questions were discussed and answered.      Impression and Recommendations:     This case required medical decision making of moderate complexity.

## 2014-07-23 NOTE — Patient Instructions (Addendum)
Good to see you.  Exercises 3 times a week. Do them after working out.  Ice 20 minute at night [facility]s get [employer] tonight Start hitting the gym 3 times a week but have a rest day between each day.  Come back in 2-3 weeks of doing this We will see how you are doing Use the medicine up to 3 times a day when having pain.

## 2014-08-12 ENCOUNTER — Ambulatory Visit: Payer: BLUE CROSS/BLUE SHIELD | Admitting: Internal Medicine

## 2014-08-15 ENCOUNTER — Ambulatory Visit: Payer: BLUE CROSS/BLUE SHIELD | Admitting: Family Medicine

## 2015-06-15 ENCOUNTER — Encounter: Payer: BLUE CROSS/BLUE SHIELD | Admitting: Internal Medicine

## 2015-06-15 ENCOUNTER — Telehealth: Payer: Self-pay | Admitting: Family Medicine

## 2015-06-15 NOTE — Progress Notes (Signed)
Document opened and reviewed for OV but appt  canceled same day .  

## 2015-06-15 NOTE — Telephone Encounter (Signed)
Left a message on home number informing the pt that he missed his appointment scheduled today.  Asked that he give the office a call to re schedule.  Left info that the office will re open tomorrow at 10 AM due to inclement weather. Also tried to contact the pt by cell phone.  Mailbox was full.  Unable to leave a message.

## 2015-06-24 ENCOUNTER — Ambulatory Visit (INDEPENDENT_AMBULATORY_CARE_PROVIDER_SITE_OTHER): Payer: BLUE CROSS/BLUE SHIELD | Admitting: Internal Medicine

## 2015-06-24 ENCOUNTER — Encounter: Payer: Self-pay | Admitting: Internal Medicine

## 2015-06-24 VITALS — BP 144/88 | Temp 98.8°F | Ht 69.5 in | Wt 154.3 lb

## 2015-06-24 DIAGNOSIS — F4323 Adjustment disorder with mixed anxiety and depressed mood: Secondary | ICD-10-CM | POA: Diagnosis not present

## 2015-06-24 DIAGNOSIS — R030 Elevated blood-pressure reading, without diagnosis of hypertension: Secondary | ICD-10-CM | POA: Diagnosis not present

## 2015-06-24 NOTE — Progress Notes (Signed)
Pre visit review using our clinic review tool, if applicable. No additional management support is needed unless otherwise documented below in the visit note.  Chief Complaint  Patient presents with  . Depression    Senior year of college.  Studying accounting.    . Anxiety    HPI: Patient Brandon Buckley  comes in today for   new problem evaluation.cocnern about depression   Apt  Feels sad and dark a lot  Working   noscial life with this Sad and cryied work.  Worried a lot  Can he do well in courses has a 3.4 gpa   accounting  And then graduate .  No deluosins hallucinations   Took one of family dos for a week or so when alone  And felt some better with companionship . Asks for letter to see if can have dog in apt  For health reasons  Has a girlfriend  . Working o Lexicographer too.  Summer soph .  Doing work and broke down and alone. But got better  ocass smoke mj  and etoh social . Getting enough sleep No hx of chiid hood depression  ,Trauma, panic attacks .  ROS: See pertinent positives and negatives per HPI.  Past Medical History  Diagnosis Date  . 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 Vascular headache 10/11/2011  . ALLERGIC RHINITIS 03/06/2009    Qualifier: Diagnosis of  By: Sarajane Jews MD, Ishmael Holter   . GYNECOMASTIA 03/06/2009    Qualifier: Diagnosis of  By: Sarajane Jews MD, Ishmael Holter     Family History  Problem Relation Age of Onset  . Hypertension      both grandparents  . Diabetes type II      both grandparents.   . Stroke      ggm  . Migraines Mother     Social History   Social History  . Marital Status: Single    Spouse Name: N/A  . Number of Children: N/A  . Years of Education: N/A   Social History Main Topics  . Smoking status: Never Smoker   . Smokeless tobacco: Never Used  . Alcohol Use: No  . Drug Use: None  . Sexual Activity: Not Asked   Other Topics Concern  . None   Social History Narrative   hh of 5    3 dogs    No ets.      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   No facility-administered medications prior to visit.     EXAM:  BP 144/88 mmHg  Temp(Src) 98.8 F (37.1 C) (Oral)  Ht 5' 9.5" (1.765 m)  Wt 154 lb 4.8 oz (69.99 kg)  BMI 22.47 kg/m2  Body mass index is 22.47 kg/(m^2). See repeat bp  Down but not normal  GENERAL: vitals reviewed and listed above, alert, oriented, appears well hydrated and in no acute distress HEENT: atraumatic, conjunctiva  clear, no obvious abnormalities on inspection of external nose and ears MS: moves all extremities without noticeable focal  abnormality PSYCH: pleasant and cooperative, no obvious depression mildy anxious   phq9 6 very difficult 2 feeling bad  About self  2 interest down appetite and concentration rest  Neg See gad 7  12 ASSESSMENT AND PLAN:  Discussed the following assessment and plan:  Adjustment disorder  with mixed anxiety and depressed mood - more anxiety seems developemntal triggers school etc  advise counseling  [facility] Glennon Hamilton info given dog copmpanionship may help letter as requested  Elevated blood pressure reading - recheck to ensure at goal  had normal last fe no recheck since then Total visit 50mns > 50% spent counseling and coordinating care as indicated in above note and in instructions to patient .     -Patient advised to return or notify health care team  if symptoms worsen ,persist or new concerns arise.  Patient Instructions   You have some symtpoms  of depression and anxiety.  They can be short lived but often need help to  Resolve .   Best  treatments involve counseling and sometimes medications.  Will write  Note to advise you should be able to  Live with your dog.    Get your blood pressure checked outside office to make sure not  continueing  elevaetd  Check once a month at least.  ROV in   1-2 months to see BP and how   You are doing  .    Adjustment Disorder Adjustment disorder is an unusually severe reaction to a stressful life event, such as the loss of a job or physical illness. The event may be any stressful event other than the loss of a loved one. Adjustment disorder may affect your feelings, your thinking, how you act, or a combination of these. It may interfere with personal relationships or with the way you are at work, school, or home. People with this disorder are at risk for suicide and substance abuse. They may develop a more serious mental disorder, such as major depressive disorder or post-traumatic stress disorder. SIGNS AND SYMPTOMS  Symptoms may include:  Sadness, depressed mood, or crying spells.  Loss of enjoyment.  Change in appetite or weight.  Sense of loss or hopelessness.  Thoughts of suicide.  Anxiety, worry, or nervousness.  Trouble sleeping.  Avoiding family and friends.  Poor school performance.  Fighting or vandalism.  Reckless driving.  Skipping school.  Poor work pSystems analyst  Ignoring bills. Symptoms of adjustment disorder start within 3 months of the stressful life event. They do not last more than 6 months after the event has ended. DIAGNOSIS  To make a diagnosis, your health care provider will ask about what has happened in your life and how it has affected you. He or she may also ask about your medical history and use of medicines, alcohol, and other substances. Your health care provider may do a physical exam and order lab tests or other studies. You may be referred to a mental health specialist for evaluation. TREATMENT  Treatment options include:  Counseling or talk therapy. Talk therapy is usually provided by mental health specialists.  Medicine. Certain medicines may help with depression, anxiety, and sleep.  Support groups. Support groups offer emotional support, advice, and guidance. They are made up of people who have had similar  experiences. HOME CARE INSTRUCTIONS  Keep all follow-up visits as directed by your health care provider. This is important.  Take medicines only as directed by your health care provider. SEEK MEDICAL CARE IF:  Your symptoms get worse.  SEEK IMMEDIATE MEDICAL CARE IF: You have serious thoughts about hurting yourself or someone else. MAKE SURE YOU:  Understand these instructions.  Will watch your condition.  Will get help right away if you are not doing well or get worse.   This information  is not intended to replace advice given to you by your health care provider. Make sure you discuss any questions you have with your health care provider.   Document Released: 01/25/2006 Document Revised: 06/13/2014 Document Reviewed: 10/15/2013 Elsevier Interactive Patient Education 2016 [place] and Stress Management Stress is a normal reaction to life events. It is what you feel when life demands more than you are used to or more than you can handle. Some stress can be useful. For example, the stress reaction can help you catch the last bus of the day, study for a test, or meet a deadline at work. But stress that occurs too often or for too long can cause problems. It can affect your emotional health and interfere with relationships and normal daily activities. Too much stress can weaken your immune system and increase your risk for physical illness. If you already have a medical problem, stress can make it worse. CAUSES  All sorts of life events may cause stress. An event that causes stress for one person may not be stressful for another person. Major life events commonly cause stress. These may be positive or negative. Examples include losing your job, moving into a new home, getting married, having a baby, or losing a loved one. Less obvious life events may also cause stress, especially if they occur day after day or in combination. Examples include working long hours, driving in  traffic, caring for children, being in debt, or being in a difficult relationship. SIGNS AND SYMPTOMS Stress may cause emotional symptoms including, the following:  Anxiety. This is feeling worried, afraid, on edge, overwhelmed, or out of control.  Anger. This is feeling irritated or impatient.  Depression. This is feeling sad, down, helpless, or guilty.  Difficulty focusing, remembering, or making decisions. Stress may cause physical symptoms, including the following:   Aches and pains. These may affect your head, neck, back, stomach, or other areas of your body.  Tight muscles or clenched jaw.  Low energy or trouble sleeping. Stress may cause unhealthy behaviors, including the following:   Eating to feel better (overeating) or skipping meals.  Sleeping too little, too much, or both.  Working too much or putting off tasks (procrastination).  Smoking, drinking alcohol, or using drugs to feel better. DIAGNOSIS  Stress is diagnosed through an assessment by your health care provider. Your health care provider will ask questions about your symptoms and any stressful life events.Your health care provider will also ask about your medical history and may order blood tests or other tests. Certain medical conditions and medicine can cause physical symptoms similar to stress. Mental illness can cause emotional symptoms and unhealthy behaviors similar to stress. Your health care provider may refer you to a mental health professional for further evaluation.  TREATMENT  Stress management is the recommended treatment for stress.The goals of stress management are reducing stressful life events and coping with stress in healthy ways.  Techniques for reducing stressful life events include the following:  Stress identification. Self-monitor for stress and identify what causes stress for you. These skills may help you to avoid some stressful events.  Time management. Set your priorities, keep a  calendar of events, and learn to say "no." These tools can help you avoid making too many commitments. Techniques for coping with stress include the following:  Rethinking the problem. Try to think realistically about stressful events rather than ignoring them or overreacting. Try to find the positives in a stressful situation rather than  focusing on the negatives.  Exercise. Physical exercise can release both physical and emotional tension. The key is to find a form of exercise you enjoy and do it regularly.  Relaxation techniques. These relax the body and mind. Examples include yoga, meditation, tai chi, biofeedback, deep breathing, progressive muscle relaxation, listening to music, being out in nature, journaling, and other hobbies. Again, the key is to find one or more that you enjoy and can do regularly.  Healthy lifestyle. Eat a balanced diet, get plenty of sleep, and do not smoke. Avoid using alcohol or drugs to relax.  Strong support network. Spend time with family, friends, or other people you enjoy being around.Express your feelings and talk things over with someone you trust. Counseling or talktherapy with a mental health professional may be helpful if you are having difficulty managing stress on your own. Medicine is typically not recommended for the treatment of stress.Talk to your health care provider if you think you need medicine for symptoms of stress. HOME CARE INSTRUCTIONS  Keep all follow-up visits as directed by your health care provider.  Take all medicines as directed by your health care provider. SEEK MEDICAL CARE IF:  Your symptoms get worse or you start having new symptoms.  You feel overwhelmed by your problems and can no longer manage them on your own. SEEK IMMEDIATE MEDICAL CARE IF:  You feel like hurting yourself or someone else.   This information is not intended to replace advice given to you by your health care provider. Make sure you discuss any  questions you have with your health care provider.   Document Released: 11/16/2000 Document Revised: 06/13/2014 Document Reviewed: 01/15/2013 Elsevier Interactive Patient Education 2016 [place]. Leialoha Hanna M.D.

## 2015-06-24 NOTE — Patient Instructions (Addendum)
You have some symtpoms  of depression and anxiety.  They can be short lived but often need help to  Resolve .   Best  treatments involve counseling and sometimes medications.  Will write  Note to advise you should be able to  Live with your dog.    Get your blood pressure checked outside office to make sure not  continueing  elevaetd  Check once a month at least.  ROV in   1-2 months to see BP and how  You are doing  .    Adjustment Disorder Adjustment disorder is an unusually severe reaction to a stressful life event, such as the loss of a job or physical illness. The event may be any stressful event other than the loss of a loved one. Adjustment disorder may affect your feelings, your thinking, how you act, or a combination of these. It may interfere with personal relationships or with the way you are at work, school, or home. People with this disorder are at risk for suicide and substance abuse. They may develop a more serious mental disorder, such as major depressive disorder or post-traumatic stress disorder. SIGNS AND SYMPTOMS  Symptoms may include:  Sadness, depressed mood, or crying spells.  Loss of enjoyment.  Change in appetite or weight.  Sense of loss or hopelessness.  Thoughts of suicide.  Anxiety, worry, or nervousness.  Trouble sleeping.  Avoiding family and friends.  Poor school performance.  Fighting or vandalism.  Reckless driving.  Skipping school.  Poor work Systems analyst.  Ignoring bills. Symptoms of adjustment disorder start within 3 months of the stressful life event. They do not last more than 6 months after the event has ended. DIAGNOSIS  To make a diagnosis, your health care provider will ask about what has happened in your life and how it has affected you. He or she may also ask about your medical history and use of medicines, alcohol, and other substances. Your health care provider may do a physical exam and order lab tests or other studies.  You may be referred to a mental health specialist for evaluation. TREATMENT  Treatment options include:  Counseling or talk therapy. Talk therapy is usually provided by mental health specialists.  Medicine. Certain medicines may help with depression, anxiety, and sleep.  Support groups. Support groups offer emotional support, advice, and guidance. They are made up of people who have had similar experiences. HOME CARE INSTRUCTIONS  Keep all follow-up visits as directed by your health care provider. This is important.  Take medicines only as directed by your health care provider. SEEK MEDICAL CARE IF:  Your symptoms get worse.  SEEK IMMEDIATE MEDICAL CARE IF: You have serious thoughts about hurting yourself or someone else. MAKE SURE YOU:  Understand these instructions.  Will watch your condition.  Will get help right away if you are not doing well or get worse.   This information is not intended to replace advice given to you by your health care provider. Make sure you discuss any questions you have with your health care provider.   Document Released: 01/25/2006 Document Revised: 06/13/2014 Document Reviewed: 10/15/2013 Elsevier Interactive Patient Education 2016 [place] and Stress Management Stress is a normal reaction to life events. It is what you feel when life demands more than you are used to or more than you can handle. Some stress can be useful. For example, the stress reaction can help you catch the last bus of the day, study for a  test, or meet a deadline at work. But stress that occurs too often or for too long can cause problems. It can affect your emotional health and interfere with relationships and normal daily activities. Too much stress can weaken your immune system and increase your risk for physical illness. If you already have a medical problem, stress can make it worse. CAUSES  All sorts of life events may cause stress. An event that causes stress  for one person may not be stressful for another person. Major life events commonly cause stress. These may be positive or negative. Examples include losing your job, moving into a new home, getting married, having a baby, or losing a loved one. Less obvious life events may also cause stress, especially if they occur day after day or in combination. Examples include working long hours, driving in traffic, caring for children, being in debt, or being in a difficult relationship. SIGNS AND SYMPTOMS Stress may cause emotional symptoms including, the following:  Anxiety. This is feeling worried, afraid, on edge, overwhelmed, or out of control.  Anger. This is feeling irritated or impatient.  Depression. This is feeling sad, down, helpless, or guilty.  Difficulty focusing, remembering, or making decisions. Stress may cause physical symptoms, including the following:   Aches and pains. These may affect your head, neck, back, stomach, or other areas of your body.  Tight muscles or clenched jaw.  Low energy or trouble sleeping. Stress may cause unhealthy behaviors, including the following:   Eating to feel better (overeating) or skipping meals.  Sleeping too little, too much, or both.  Working too much or putting off tasks (procrastination).  Smoking, drinking alcohol, or using drugs to feel better. DIAGNOSIS  Stress is diagnosed through an assessment by your health care provider. Your health care provider will ask questions about your symptoms and any stressful life events.Your health care provider will also ask about your medical history and may order blood tests or other tests. Certain medical conditions and medicine can cause physical symptoms similar to stress. Mental illness can cause emotional symptoms and unhealthy behaviors similar to stress. Your health care provider may refer you to a mental health professional for further evaluation.  TREATMENT  Stress management is the  recommended treatment for stress.The goals of stress management are reducing stressful life events and coping with stress in healthy ways.  Techniques for reducing stressful life events include the following:  Stress identification. Self-monitor for stress and identify what causes stress for you. These skills may help you to avoid some stressful events.  Time management. Set your priorities, keep a calendar of events, and learn to say "no." These tools can help you avoid making too many commitments. Techniques for coping with stress include the following:  Rethinking the problem. Try to think realistically about stressful events rather than ignoring them or overreacting. Try to find the positives in a stressful situation rather than focusing on the negatives.  Exercise. Physical exercise can release both physical and emotional tension. The key is to find a form of exercise you enjoy and do it regularly.  Relaxation techniques. These relax the body and mind. Examples include yoga, meditation, tai chi, biofeedback, deep breathing, progressive muscle relaxation, listening to music, being out in nature, journaling, and other hobbies. Again, the key is to find one or more that you enjoy and can do regularly.  Healthy lifestyle. Eat a balanced diet, get plenty of sleep, and do not smoke. Avoid using alcohol or drugs to relax.  Strong support network. Spend time with family, friends, or other people you enjoy being around.Express your feelings and talk things over with someone you trust. Counseling or talktherapy with a mental health professional may be helpful if you are having difficulty managing stress on your own. Medicine is typically not recommended for the treatment of stress.Talk to your health care provider if you think you need medicine for symptoms of stress. HOME CARE INSTRUCTIONS  Keep all follow-up visits as directed by your health care provider.  Take all medicines as directed by  your health care provider. SEEK MEDICAL CARE IF:  Your symptoms get worse or you start having new symptoms.  You feel overwhelmed by your problems and can no longer manage them on your own. SEEK IMMEDIATE MEDICAL CARE IF:  You feel like hurting yourself or someone else.   This information is not intended to replace advice given to you by your health care provider. Make sure you discuss any questions you have with your health care provider.   Document Released: 11/16/2000 Document Revised: 06/13/2014 Document Reviewed: 01/15/2013 Elsevier Interactive Patient Education [employer].

## 2015-06-25 ENCOUNTER — Telehealth: Payer: Self-pay | Admitting: Internal Medicine

## 2015-06-25 NOTE — Telephone Encounter (Signed)
Pt notified by front desk staff to pick up at the desk.

## 2015-06-25 NOTE — Telephone Encounter (Signed)
Tried to reach the pt.  His mailbox is full.  Unable to leave a message. Pt needs to be notified that his letter is ready and at the front desk.

## 2015-06-25 NOTE — Telephone Encounter (Signed)
Patient returned Misty's telephone call.

## 2015-08-26 ENCOUNTER — Ambulatory Visit: Payer: BLUE CROSS/BLUE SHIELD | Admitting: Internal Medicine

## 2015-12-26 DIAGNOSIS — M79644 Pain in right finger(s): Secondary | ICD-10-CM | POA: Diagnosis not present

## 2015-12-26 DIAGNOSIS — S60021A Contusion of right index finger without damage to nail, initial encounter: Secondary | ICD-10-CM | POA: Diagnosis not present

## 2016-06-28 DIAGNOSIS — Z23 Encounter for immunization: Secondary | ICD-10-CM | POA: Diagnosis not present

## 2017-01-24 NOTE — Progress Notes (Deleted)
   No chief complaint on file.   HPI: Brandon Buckley 21 y.o.  Problem based visit  ROS: See pertinent positives and negatives per HPI.  Past Medical History:  Diagnosis Date  . ALLERGIC RHINITIS 03/06/2009   Qualifier: Diagnosis of  By: Clent Ridges MD, Tera Mater   . 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 GYNECOMASTIA 03/06/2009   Qualifier: Diagnosis of  By: Clent Ridges MD, Tera Mater   . Vascular headache 10/11/2011    Family History  Problem Relation Age of Onset  . Hypertension Unknown        both grandparents  . Diabetes type II Unknown        both grandparents.   . Stroke Unknown        ggm  . Migraines Mother     Social History   Social History  . Marital status: Single    Spouse name: N/A  . Number of children: N/A  . Years of education: N/A   Social History Main Topics  . Smoking status: Never Smoker  . Smokeless tobacco: Never Used  . Alcohol use No  . Drug use: Unknown  . Sexual activity: Not on file   Other Topics Concern  . Not on file   Social History Narrative   hh of 5    3 dogs    No ets.      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   No facility-administered medications prior to visit.      EXAM:  There were no vitals taken for this visit.  There is no height or weight on file to calculate BMI.  GENERAL: vitals reviewed and listed above, alert, oriented, appears well hydrated and in no acute distress HEENT: atraumatic, conjunctiva  clear, no obvious abnormalities on inspection of external nose and ears OP : no lesion edema or exudate  NECK: no obvious masses on inspection palpation  LUNGS: clear to auscultation bilaterally, no wheezes, rales or rhonchi, good air movement CV: HRRR, no clubbing cyanosis or  peripheral edema nl cap refill  MS: moves all extremities without noticeable focal  abnormality PSYCH: pleasant and cooperative, no  obvious depression or anxiety  ASSESSMENT AND PLAN:  Discussed the following assessment and plan:  No diagnosis found.  -Patient advised to return or notify health care team  if symptoms worsen ,persist or new concerns arise.  There are no Patient Instructions on file for this visit.   Neta Mends. Omnia Dollinger M.D.

## 2017-01-25 ENCOUNTER — Ambulatory Visit: Payer: BLUE CROSS/BLUE SHIELD | Admitting: Internal Medicine

## 2017-01-29 NOTE — Progress Notes (Signed)
Chief Complaint  Patient presents with  . Acute Visit    Pt here to discuss dry skin on his face under lips on going since May has tried some coco butter, also has the dry skin type rash on his both ankles     HPI: Brandon Buckley 21 y.o.  Acute problem  Last ov 1 2017 He states he has a rash mildly itchy on his left lower extremity medial distalfor almost a year he is put there is creams on including over-the-counter hydrocortisone not sure it helps perhaps waxes and wanes. We'll recently has had some irritation below his lower lip and used a number of different topicalsrecently has used Abreva over-the-counter which has helped improve uncertain reason he has no history of cold sores or ulcers. Denies any kind of contact lens that could be causing his left lower extremity rash. Doesn't tend to wear socks or things that could come in contact with his skin.  ROS: See pertinent positives and negatives per HPI.  Past Medical History:  Diagnosis Date  . ALLERGIC RHINITIS 03/06/2009   Qualifier: Diagnosis of  By: Clent Ridges MD, Tera Mater   . 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 GYNECOMASTIA 03/06/2009   Qualifier: Diagnosis of  By: Clent Ridges MD, Tera Mater   . Vascular headache 10/11/2011    Family History  Problem Relation Age of Onset  . Hypertension Unknown        both grandparents  . Diabetes type II Unknown        both grandparents.   . Stroke Unknown        ggm  . Migraines Mother     Social History   Social History  . Marital status: Single    Spouse name: N/A  . Number of children: N/A  . Years of education: N/A   Social History Main Topics  . Smoking status: Never Smoker  . Smokeless tobacco: Never Used  . Alcohol use No  . Drug use: Unknown  . Sexual activity: Not Asked   Other Topics Concern  . None   Social History Narrative   hh of 5    3 dogs    No ets.      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   No facility-administered medications prior to visit.      EXAM:  BP 122/64 (BP Location: Right Arm, Patient Position: Sitting, Cuff Size: Normal)   Pulse 75   Ht 5' 9.5" (1.765 m)   Wt 151 lb 6.4 oz (68.7 kg)   SpO2 97%   BMI 22.04 kg/m   Body mass index is 22.04 kg/m.  GENERAL: vitals reviewed and listed above, alert, oriented, appears well hydrated and in no acute distress HEENT: atraumatic, conjunctiva  clear, no obvious abnormalities on inspection of external nose and ears Face is pretty clear just below his bottom lip is some hyperpigmentation but no acute rash or vesicles no cracking or fissuring.NECK: no obvious masses on inspection palpation  LMS: moves all extremities without noticeable focal  Abnormality Left lower extremity shows a large area about 8-10 cm of pinkish hyperpigmentation that he is a bit palpable with inferiorly a very discrete edge. Almost looks like where an edge of the sock was but patient states he doesn't wear socks. That high. There are no  vesicles there is a little bit of flaking. PSYCH: pleasant and cooperative, no obvious depression or anxiety  ASSESSMENT AND PLAN:  Discussed the following assessment and plan:  Rash - large oivid on left leg ? contact vs partl;y rx tinea?  atypical  empric rx lotrimin bid for 2-3 weeks and top tmc  Perioral hyperpigmentation - lower lip assuming post inflammatory   -Patient advised to return or notify health care team  if symptoms worsen ,persist or new concerns arise.  Patient Instructions  On the legs   .   Take the lotrimin    Twice a day and add   rx  cortison e To treat for  inflammation and poss partely rx skin funguss.   If not better in 2 weeks then  We can see dermatologist .    Try aquaphor  Ointment or similar on the lips  Area    .      Pigmentation takes a while to go back to normal after inflammation is gone    Use of cortisone on face can be  problematic  And cause a rebound rash .        Neta Mends. Julianne Chamberlin M.D.

## 2017-01-30 ENCOUNTER — Ambulatory Visit (INDEPENDENT_AMBULATORY_CARE_PROVIDER_SITE_OTHER): Payer: BLUE CROSS/BLUE SHIELD | Admitting: Internal Medicine

## 2017-01-30 ENCOUNTER — Encounter: Payer: Self-pay | Admitting: Internal Medicine

## 2017-01-30 VITALS — BP 122/64 | HR 75 | Ht 69.5 in | Wt 151.4 lb

## 2017-01-30 DIAGNOSIS — L818 Other specified disorders of pigmentation: Secondary | ICD-10-CM | POA: Diagnosis not present

## 2017-01-30 DIAGNOSIS — R21 Rash and other nonspecific skin eruption: Secondary | ICD-10-CM

## 2017-01-30 DIAGNOSIS — L819 Disorder of pigmentation, unspecified: Secondary | ICD-10-CM

## 2017-01-30 MED ORDER — TRIAMCINOLONE ACETONIDE 0.1 % EX CREA: 1.0000 "application " | TOPICAL_CREAM | Freq: Two times a day (BID) | CUTANEOUS | 1 refills | Status: DC

## 2017-01-30 NOTE — Patient Instructions (Addendum)
On the legs   .   Take the lotrimin    Twice a day and add   rx  cortison e To treat for  inflammation and poss partely rx skin funguss.   If not better in 2 weeks then  We can see dermatologist .    Try aquaphor  Ointment or similar on the lips  Area    .      Pigmentation takes a while to go back to normal after inflammation is gone    Use of cortisone on face can be problematic  And cause a rebound rash .

## 2017-03-08 NOTE — Progress Notes (Signed)
Chief Complaint  Patient presents with  . Acute Visit    Pt is c/o lower back pain on his right side, getting worse of the past two weeks     HPI: Brandon Buckley 21 y.o.  sda    For back pain  Hs been seen for back pain  hasd seen dr Katrinka Blazing for back pain  2016 No acute abnormality.  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  Over the last couple weeks he has noted some right para lumbar pain comes and goes but can be 20 and sharp occasionally may go down into his buttocks not sure about his leg. Says this is a different pain . No specific injury seems to be worse with sitting long periods of time and walking might make it better. He is just restarted his back exercises No fever urinary symptoms except drinks a lot of water and goes frequently. Wants to make sure it's not his kidney. No radiation except as above. No weakness in his lower extremity or dysfunction. ROS: See pertinent positives and negatives per HPI.  Past Medical History:  Diagnosis Date  . ALLERGIC RHINITIS 03/06/2009   Qualifier: Diagnosis of  By: Clent Ridges MD, Tera Mater   . 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 GYNECOMASTIA 03/06/2009   Qualifier: Diagnosis of  By: Clent Ridges MD, Tera Mater   . Vascular headache 10/11/2011    Family History  Problem Relation Age of Onset  . Hypertension Unknown        both grandparents  . Diabetes type II Unknown        both grandparents.   . Stroke Unknown        ggm  . Migraines Mother     Social History   Social History  . Marital status: Single    Spouse name: N/A  . Number of children: N/A  . Years of education: N/A   Social History Main Topics  . Smoking status: Never Smoker  . Smokeless tobacco: Never Used  . Alcohol use No  . Drug use: Unknown  . Sexual activity: Not Asked   Other Topics Concern  . None   Social History Narrative   hh of 5    3 dogs    No ets.      Work at [employer] and hours vary and then an  evening job is moving       Psychologist, occupational    Outpatient Medications Prior to Visit  Medication Sig Dispense Refill  . triamcinolone cream (KENALOG) 0.1 % Apply 1 application topically 2 (two) times daily. Not on face (Patient not taking: Reported on 03/09/2017) 30 g 1   No facility-administered medications prior to visit.      EXAM:  BP 132/82 (BP Location: Right Arm, Patient Position: Sitting, Cuff Size: Normal)   Pulse 75   Temp 98.5 F (36.9 C) (Oral)   Ht 5' 9.5" (1.765 m)   Wt 149 lb 12.8 oz (67.9 kg)   SpO2 98%   BMI 21.80 kg/m   Body mass index is 21.8 kg/m.  GENERAL: vitals reviewed and listed above, alert, oriented, appears well hydrated and in no acute distress HEENT: atraumatic, conjunctiva  clear, no obvious abnormalities on inspection of external nose and ears CV: HRRR, no clubbing cyanosis or  peripheral edema nl cap refill  MS: moves all extremities without noticeable focal  Abnormality Spine appears to  be straight localized possible tenderness at the right SI joint area no swelling negative SLR normal motor lower extremity. And range of motion. PSYCH: pleasant and cooperative,   ASSESSMENT AND PLAN:  Discussed the following assessment and plan:  Sacro ilial pain ? - seems localized no alarming  no sytemic sx conservative care and  observation . coul dhave some pyrifomis syndrome also  Low back pain with right-sided sciatica, unspecified back pain laterality, unspecified chronicity - Plan: POCT urinalysis dipstick This has been fully explained to the patient, who indicates understanding. Fu if  persistent or progressive  -Patient advised to return or notify health care team  if symptoms worsen ,persist or new concerns arise.  Patient Instructions  Urine test is good   Not your kidneys or  Internal organs as a cause.  The acts like  sacroiliac  joimt   area  . Mechanical  Pain.   Advises your back exercise  Avoid prolong sitting    Ice or  cold after activity    advil aleve course 5 days may help calm down .   Back Exercises The following exercises strengthen the muscles that help to support the back. They also help to keep the lower back flexible. Doing these exercises can help to prevent back pain or lessen existing pain. If you have back pain or discomfort, try doing these exercises 2-3 times each day or as told by your health care provider. When the pain goes away, do them once each day, but increase the number of times that you repeat the steps for each exercise (do more repetitions). If you do not have back pain or discomfort, do these exercises once each day or as told by your health care provider. Exercises Single Knee to Chest  Repeat these steps 3-5 times for each leg: 1. Lie on your back on a firm bed or the floor with your legs extended. 2. Bring one knee to your chest. Your other leg should stay extended and in contact with the floor. 3. Hold your knee in place by grabbing your knee or thigh. 4. Pull on your knee until you feel a gentle stretch in your lower back. 5. Hold the stretch for 10-30 seconds. 6. Slowly release and straighten your leg.  Pelvic Tilt  Repeat these steps 5-10 times: 1. Lie on your back on a firm bed or the floor with your legs extended. 2. Bend your knees so they are pointing toward the ceiling and your feet are flat on the floor. 3. Tighten your lower abdominal muscles to press your lower back against the floor. This motion will tilt your pelvis so your tailbone points up toward the ceiling instead of pointing to your feet or the floor. 4. With gentle tension and even breathing, hold this position for 5-10 seconds.  Cat-Cow  Repeat these steps until your lower back becomes more flexible: 1. Get into a hands-and-knees position on a firm surface. Keep your hands under your shoulders, and keep your knees under your hips. You may place padding under your knees for comfort. 2. Let your  head hang down, and point your tailbone toward the floor so your lower back becomes rounded like the back of a cat. 3. Hold this position for 5 seconds. 4. Slowly lift your head and point your tailbone up toward the ceiling so your back forms a sagging arch like the back of a cow. 5. Hold this position for 5 seconds.  Press-Ups  Repeat these steps 5-10  times: 1. Lie on your abdomen (face-down) on the floor. 2. Place your palms near your head, about shoulder-width apart. 3. While you keep your back as relaxed as possible and keep your hips on the floor, slowly straighten your arms to raise the top half of your body and lift your shoulders. Do not use your back muscles to raise your upper torso. You may adjust the placement of your hands to make yourself more comfortable. 4. Hold this position for 5 seconds while you keep your back relaxed. 5. Slowly return to lying flat on the floor.  Bridges  Repeat these steps 10 times: 1. Lie on your back on a firm surface. 2. Bend your knees so they are pointing toward the ceiling and your feet are flat on the floor. 3. Tighten your buttocks muscles and lift your buttocks off of the floor until your waist is at almost the same height as your knees. You should feel the muscles working in your buttocks and the back of your thighs. If you do not feel these muscles, slide your feet 1-2 inches farther away from your buttocks. 4. Hold this position for 3-5 seconds. 5. Slowly lower your hips to the starting position, and allow your buttocks muscles to relax completely.  If this exercise is too easy, try doing it with your arms crossed over your chest. Abdominal Crunches  Repeat these steps 5-10 times: 1. Lie on your back on a firm bed or the floor with your legs extended. 2. Bend your knees so they are pointing toward the ceiling and your feet are flat on the floor. 3. Cross your arms over your chest. 4. Tip your chin slightly toward your chest without  bending your neck. 5. Tighten your abdominal muscles and slowly raise your trunk (torso) high enough to lift your shoulder blades a tiny bit off of the floor. Avoid raising your torso higher than that, because it can put too much stress on your low back and it does not help to strengthen your abdominal muscles. 6. Slowly return to your starting position.  Back Lifts Repeat these steps 5-10 times: 1. Lie on your abdomen (face-down) with your arms at your sides, and rest your forehead on the floor. 2. Tighten the muscles in your legs and your buttocks. 3. Slowly lift your chest off of the floor while you keep your hips pressed to the floor. Keep the back of your head in line with the curve in your back. Your eyes should be looking at the floor. 4. Hold this position for 3-5 seconds. 5. Slowly return to your starting position.  Contact a health care provider if:  Your back pain or discomfort gets much worse when you do an exercise.  Your back pain or discomfort does not lessen within 2 hours after you exercise. If you have any of these problems, stop doing these exercises right away. Do not do them again unless your health care provider says that you can. Get help right away if:  You develop sudden, severe back pain. If this happens, stop doing the exercises right away. Do not do them again unless your health care provider says that you can. This information is not intended to replace advice given to you by your health care provider. Make sure you discuss any questions you have with your health care provider. Document Released: 06/30/2004 Document Revised: 09/30/2015 Document Reviewed: 07/17/2014 Elsevier Interactive Patient Education  2017 [employer].  Standley Brooking. Sultan Pargas M.D.

## 2017-03-09 ENCOUNTER — Encounter: Payer: Self-pay | Admitting: Internal Medicine

## 2017-03-09 ENCOUNTER — Ambulatory Visit (INDEPENDENT_AMBULATORY_CARE_PROVIDER_SITE_OTHER): Payer: BLUE CROSS/BLUE SHIELD | Admitting: Internal Medicine

## 2017-03-09 VITALS — BP 132/82 | HR 75 | Temp 98.5°F | Ht 69.5 in | Wt 149.8 lb

## 2017-03-09 DIAGNOSIS — M533 Sacrococcygeal disorders, not elsewhere classified: Secondary | ICD-10-CM

## 2017-03-09 DIAGNOSIS — M5441 Lumbago with sciatica, right side: Secondary | ICD-10-CM | POA: Diagnosis not present

## 2017-03-09 LAB — POCT URINALYSIS DIPSTICK
BILIRUBIN UA: NEGATIVE
Blood, UA: NEGATIVE
Glucose, UA: NEGATIVE
Ketones, UA: NEGATIVE
Leukocytes, UA: NEGATIVE
NITRITE UA: NEGATIVE
PH UA: 6 (ref 5.0–8.0)
Protein, UA: NEGATIVE
Spec Grav, UA: 1.015 (ref 1.010–1.025)
Urobilinogen, UA: 0.2 E.U./dL

## 2017-03-09 NOTE — Patient Instructions (Addendum)
Urine test is good   Not your kidneys or  Internal organs as a cause.  The acts like  sacroiliac  joimt   area  . Mechanical  Pain.   Advises your back exercise  Avoid prolong sitting    Ice or cold after activity    advil aleve course 5 days may help calm down .   Back Exercises The following exercises strengthen the muscles that help to support the back. They also help to keep the lower back flexible. Doing these exercises can help to prevent back pain or lessen existing pain. If you have back pain or discomfort, try doing these exercises 2-3 times each day or as told by your health care provider. When the pain goes away, do them once each day, but increase the number of times that you repeat the steps for each exercise (do more repetitions). If you do not have back pain or discomfort, do these exercises once each day or as told by your health care provider. Exercises Single Knee to Chest  Repeat these steps 3-5 times for each leg: 1. Lie on your back on a firm bed or the floor with your legs extended. 2. Bring one knee to your chest. Your other leg should stay extended and in contact with the floor. 3. Hold your knee in place by grabbing your knee or thigh. 4. Pull on your knee until you feel a gentle stretch in your lower back. 5. Hold the stretch for 10-30 seconds. 6. Slowly release and straighten your leg.  Pelvic Tilt  Repeat these steps 5-10 times: 1. Lie on your back on a firm bed or the floor with your legs extended. 2. Bend your knees so they are pointing toward the ceiling and your feet are flat on the floor. 3. Tighten your lower abdominal muscles to press your lower back against the floor. This motion will tilt your pelvis so your tailbone points up toward the ceiling instead of pointing to your feet or the floor. 4. With gentle tension and even breathing, hold this position for 5-10 seconds.  Cat-Cow  Repeat these steps until your lower back becomes more  flexible: 1. Get into a hands-and-knees position on a firm surface. Keep your hands under your shoulders, and keep your knees under your hips. You may place padding under your knees for comfort. 2. Let your head hang down, and point your tailbone toward the floor so your lower back becomes rounded like the back of a cat. 3. Hold this position for 5 seconds. 4. Slowly lift your head and point your tailbone up toward the ceiling so your back forms a sagging arch like the back of a cow. 5. Hold this position for 5 seconds.  Press-Ups  Repeat these steps 5-10 times: 1. Lie on your abdomen (face-down) on the floor. 2. Place your palms near your head, about shoulder-width apart. 3. While you keep your back as relaxed as possible and keep your hips on the floor, slowly straighten your arms to raise the top half of your body and lift your shoulders. Do not use your back muscles to raise your upper torso. You may adjust the placement of your hands to make yourself more comfortable. 4. Hold this position for 5 seconds while you keep your back relaxed. 5. Slowly return to lying flat on the floor.  Bridges  Repeat these steps 10 times: 1. Lie on your back on a firm surface. 2. Bend your knees so they are pointing  toward the ceiling and your feet are flat on the floor. 3. Tighten your buttocks muscles and lift your buttocks off of the floor until your waist is at almost the same height as your knees. You should feel the muscles working in your buttocks and the back of your thighs. If you do not feel these muscles, slide your feet 1-2 inches farther away from your buttocks. 4. Hold this position for 3-5 seconds. 5. Slowly lower your hips to the starting position, and allow your buttocks muscles to relax completely.  If this exercise is too easy, try doing it with your arms crossed over your chest. Abdominal Crunches  Repeat these steps 5-10 times: 1. Lie on your back on a firm bed or the floor with  your legs extended. 2. Bend your knees so they are pointing toward the ceiling and your feet are flat on the floor. 3. Cross your arms over your chest. 4. Tip your chin slightly toward your chest without bending your neck. 5. Tighten your abdominal muscles and slowly raise your trunk (torso) high enough to lift your shoulder blades a tiny bit off of the floor. Avoid raising your torso higher than that, because it can put too much stress on your low back and it does not help to strengthen your abdominal muscles. 6. Slowly return to your starting position.  Back Lifts Repeat these steps 5-10 times: 1. Lie on your abdomen (face-down) with your arms at your sides, and rest your forehead on the floor. 2. Tighten the muscles in your legs and your buttocks. 3. Slowly lift your chest off of the floor while you keep your hips pressed to the floor. Keep the back of your head in line with the curve in your back. Your eyes should be looking at the floor. 4. Hold this position for 3-5 seconds. 5. Slowly return to your starting position.  Contact a health care provider if:  Your back pain or discomfort gets much worse when you do an exercise.  Your back pain or discomfort does not lessen within 2 hours after you exercise. If you have any of these problems, stop doing these exercises right away. Do not do them again unless your health care provider says that you can. Get help right away if:  You develop sudden, severe back pain. If this happens, stop doing the exercises right away. Do not do them again unless your health care provider says that you can. This information is not intended to replace advice given to you by your health care provider. Make sure you discuss any questions you have with your health care provider. Document Released: 06/30/2004 Document Revised: 09/30/2015 Document Reviewed: 07/17/2014 Elsevier Interactive Patient Education  2017 [employer].

## 2017-05-18 ENCOUNTER — Ambulatory Visit (INDEPENDENT_AMBULATORY_CARE_PROVIDER_SITE_OTHER): Payer: BLUE CROSS/BLUE SHIELD | Admitting: Family Medicine

## 2017-05-18 ENCOUNTER — Encounter: Payer: Self-pay | Admitting: Family Medicine

## 2017-05-18 VITALS — BP 130/70 | HR 74 | Temp 98.1°F | Wt 160.9 lb

## 2017-05-18 DIAGNOSIS — Z202 Contact with and (suspected) exposure to infections with a predominantly sexual mode of transmission: Secondary | ICD-10-CM | POA: Diagnosis not present

## 2017-05-18 MED ORDER — AZITHROMYCIN 500 MG PO TABS: 1000.0000 mg | ORAL_TABLET | Freq: Once | ORAL | 0 refills | Status: DC

## 2017-05-18 MED ORDER — AZITHROMYCIN 500 MG PO TABS: 1000.0000 mg | ORAL_TABLET | Freq: Once | ORAL | 0 refills | Status: AC

## 2017-05-18 MED ORDER — CEFTRIAXONE SODIUM 250 MG IJ SOLR
250.0000 mg | Freq: Once | INTRAMUSCULAR | Status: AC
  Administered 2017-05-18: 250 mg via INTRAMUSCULAR

## 2017-05-18 NOTE — Progress Notes (Signed)
Subjective:    Patient ID: Brandon Buckley, male    DOB: 01-Jul-1995, 21 y.o.   MRN: [id number]  No chief complaint on file.   HPI Patient was seen today for acute concern.  Pt states a sexual partner informed him she had chlamydia.  Pt presents for testing and treatment.  He endorses not using a condom.  Pt denies burning or pain with urination, penile d/c, or other issues.   Past Medical History:  Diagnosis Date  . ALLERGIC RHINITIS 03/06/2009   Qualifier: Diagnosis of  By: Clent RidgesFry MD, Tera MaterStephen A   . 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 GYNECOMASTIA 03/06/2009   Qualifier: Diagnosis of  By: Clent RidgesFry MD, Tera MaterStephen A   . Vascular headache 10/11/2011    No Known Allergies  ROS General: Denies fever, chills, night sweats, changes in weight, changes in appetite   +possible STI exposure HEENT: Denies headaches, ear pain, changes in vision, rhinorrhea, sore throat CV: Denies CP, palpitations, SOB, orthopnea Pulm: Denies SOB, cough, wheezing GI: Denies abdominal pain, nausea, vomiting, diarrhea, constipation GU: Denies dysuria, hematuria, frequency, vaginal discharge Msk: Denies muscle cramps, joint pains Neuro: Denies weakness, numbness, tingling Skin: Denies rashes, bruising Psych: Denies depression, anxiety, hallucinations     Objective:    Blood pressure 130/70, pulse 74, temperature 98.1 F (36.7 C), temperature source Oral, weight 160 lb 14.4 oz (73 kg).   Gen. Pleasant, well-nourished, in no distress, normal affect  HEENT: [place]/AT, face symmetric, conjunctiva clear, no scleral icterus, PERRLA, EOMI, nares patent without drainage Lungs: no accessory muscle use, CTAB, no wheezes or rales Cardiovascular: RRR, no m/r/g, no peripheral edema Neuro:  A&Ox3, CN II-XII intact, normal gait Skin:  Warm, no lesions/ rash   Wt Readings from Last 3 Encounters:  05/18/17 160 lb 14.4 oz (73 kg)  03/09/17 149 lb 12.8 oz (67.9 kg)  01/30/17 151 lb 6.4 oz (68.7 kg)    Lab Results  Component Value  Date   WBC 6.0 07/15/2014   HGB 14.8 07/15/2014   HCT 42.9 07/15/2014   PLT 274.0 07/15/2014   GLUCOSE 89 07/15/2014   CHOL 144 07/15/2014   TRIG 58.0 07/15/2014   HDL 65.40 07/15/2014   LDLCALC 67 07/15/2014   ALT 8 07/15/2014   AST 15 07/15/2014   NA 137 07/15/2014   K 4.2 07/15/2014   CL 103 07/15/2014   CREATININE 0.80 07/15/2014   BUN 11 07/15/2014   CO2 29 07/15/2014   TSH 0.88 07/15/2014    Assessment/Plan:  Exposure to sexually transmitted disease (STD) -Discussed safe sex practices.  -Discussed notifying additional partners. -Pt in agreement to treat for both Chlamydia and gonorrhea -will send urine sample to lab for testing. -Plan: azithromycin (ZITHROMAX) 500 MG tablet, cefTRIAXone (ROCEPHIN) injection 250 mg, C. Trachomatis, N. Gonorrhea RNA urine  F/u prn.

## 2017-05-18 NOTE — Addendum Note (Signed)
Addended by: Bonnye FavaKWEI, Tracy Kinner K on: 05/18/2017 12:58 PM   Modules accepted: Orders

## 2017-05-18 NOTE — Patient Instructions (Addendum)
Sexually Transmitted Disease  A sexually transmitted disease (STD) is a disease or infection that may be passed (transmitted) from person to person, usually during sexual activity. This may happen by way of saliva, semen, blood, vaginal mucus, or urine. Common STDs include:   Gonorrhea.   Chlamydia.   Syphilis.   HIV and AIDS.   Genital herpes.   Hepatitis B and C.   Trichomonas.   Human papillomavirus (HPV).   Pubic lice.   Scabies.   Mites.   Bacterial vaginosis.    What are the causes?  An STD may be caused by bacteria, a virus, or parasites. STDs are often transmitted during sexual activity if one person is infected. However, they may also be transmitted through nonsexual means. STDs may be transmitted after:   Sexual intercourse with an infected person.   Sharing sex toys with an infected person.   Sharing needles with an infected person or using unclean piercing or tattoo needles.   Having intimate contact with the genitals, mouth, or rectal areas of an infected person.   Exposure to infected fluids during birth.    What are the signs or symptoms?  Different STDs have different symptoms. Some people may not have any symptoms. If symptoms are present, they may include:   Painful or bloody urination.   Pain in the pelvis, abdomen, vagina, anus, throat, or eyes.   A skin rash, itching, or irritation.   Growths, ulcerations, blisters, or sores in the genital and anal areas.   Abnormal vaginal discharge with or without bad odor.   Penile discharge in men.   Fever.   Pain or bleeding during sexual intercourse.   Swollen glands in the groin area.   Yellow skin and eyes (jaundice). This is seen with hepatitis.   Swollen testicles.   Infertility.   Sores and blisters in the mouth.    How is this diagnosed?  To make a diagnosis, your health care provider may:   Take a medical history.   Perform a physical exam.   Take a sample of any discharge to examine.   Swab the throat, cervix,  opening to the penis, rectum, or vagina for testing.   Test a sample of your first morning urine.   Perform blood tests.   Perform a Pap test, if this applies.   Perform a colposcopy.   Perform a laparoscopy.    How is this treated?  Treatment depends on the STD. Some STDs may be treated but not cured.   Chlamydia, gonorrhea, trichomonas, and syphilis can be cured with antibiotic medicine.   Genital herpes, hepatitis, and HIV can be treated, but not cured, with prescribed medicines. The medicines lessen symptoms.   Genital warts from HPV can be treated with medicine or by freezing, burning (electrocautery), or surgery. Warts may come back.   HPV cannot be cured with medicine or surgery. However, abnormal areas may be removed from the cervix, vagina, or vulva.   If your diagnosis is confirmed, your recent sexual partners need treatment. This is true even if they are symptom-free or have a negative culture or evaluation. They should not have sex until their health care providers say it is okay.   Your health care provider may test you for infection again 3 months after treatment.    How is this prevented?  Take these steps to reduce your risk of getting an STD:   Use latex condoms, dental dams, and water-soluble lubricants during sexual activity. Do not use   petroleum jelly or oils.   Avoid having multiple sex partners.   Do not have sex with someone who has other sex partners.   Do not have sex with anyone you do not know or who is at high risk for an STD.   Avoid risky sex practices that can break your skin.   Do not have sex if you have open sores on your mouth or skin.   Avoid drinking too much alcohol or taking illegal drugs. Alcohol and drugs can affect your judgment and put you in a vulnerable position.   Avoid engaging in oral and anal sex acts.   Get vaccinated for HPV and hepatitis. If you have not received these vaccines in the past, talk to your health care provider about whether one or  both might be right for you.   If you are at risk of being infected with HIV, it is recommended that you take a prescription medicine daily to prevent HIV infection. This is called pre-exposure prophylaxis (PrEP). You are considered at risk if:  ? You are a man who has sex with other men (MSM).  ? You are a heterosexual man or woman and are sexually active with more than one partner.  ? You take drugs by injection.  ? You are sexually active with a partner who has HIV.   Talk with your health care provider about whether you are at high risk of being infected with HIV. If you choose to begin PrEP, you should first be tested for HIV. You should then be tested every 3 months for as long as you are taking PrEP.    Contact a health care provider if:   See your health care provider.   Tell your sexual partner(s). They should be tested and treated for any STDs.   Do not have sex until your health care provider says it is okay.  Get help right away if:  Contact your health care provider right away if:   You have severe abdominal pain.   You are a man and notice swelling or pain in your testicles.   You are a woman and notice swelling or pain in your vagina.    This information is not intended to replace advice given to you by your health care provider. Make sure you discuss any questions you have with your health care provider.  Document Released: 08/13/2002 Document Revised: 12/11/2015 Document Reviewed: 12/11/2012  Elsevier Interactive Patient Education  2018 [employer]

## 2017-05-20 LAB — C. TRACHOMATIS/N. GONORRHOEAE RNA
C. trachomatis RNA, TMA: NOT DETECTED
N. gonorrhoeae RNA, TMA: NOT DETECTED

## 2018-01-07 DIAGNOSIS — Z7251 High risk heterosexual behavior: Secondary | ICD-10-CM | POA: Diagnosis not present

## 2018-01-07 DIAGNOSIS — N451 Epididymitis: Secondary | ICD-10-CM | POA: Diagnosis not present

## 2018-01-07 DIAGNOSIS — F17200 Nicotine dependence, unspecified, uncomplicated: Secondary | ICD-10-CM | POA: Diagnosis not present

## 2018-01-10 ENCOUNTER — Encounter: Payer: Self-pay | Admitting: *Deleted

## 2018-01-10 ENCOUNTER — Ambulatory Visit: Payer: Self-pay | Admitting: Internal Medicine

## 2018-01-10 NOTE — Telephone Encounter (Signed)
This encounter was created in error - please disregard.

## 2018-01-10 NOTE — Telephone Encounter (Signed)
Pt having right groin and leg pain since last Wednesday. The pain had gotten worst on Monday and Tuesday and his pain #10. He went to an urgent care on Sunday and was prescribed an antibiotic of doxycycline. Blood sample was taken per pt.  He was diagnosed with epididymitis No discharge , frequency or fever. Denies numbness, tingling, swelling or discoloration in his leg or groin.  He states the pain is between his groin and his testicle. Appointment scheduled per protocol and pt request.  Advised to take ibuprofen to help with the pain.  Pt voiced understanding.  Reason for Disposition . [1] MODERATE pain (e.g., interferes with normal activities, limping) AND [2] present > 3 days  Answer Assessment - Initial Assessment Questions 1. ONSET: "When did the pain start?"      Wednesday 2. LOCATION: "Where is the pain located?"      Right groin and leg pain 3. PAIN: "How bad is the pain?"    (Scale 1-10; or mild, moderate, severe)   -  MILD (1-3): doesn't interfere with normal activities    -  MODERATE (4-7): interferes with normal activities (e.g., work or school) or awakens from sleep, limping    -  SEVERE (8-10): excruciating pain, unable to do any normal activities, unable to walk     Pain # 10 on Monday and Tuesday nights sitting and standing 4. WORK OR EXERCISE: "Has there been any recent work or exercise that involved this part of the body?"      no 5. CAUSE: "What do you think is causing the leg pain?"     Not sure 6. OTHER SYMPTOMS: "Do you have any other symptoms?" (e.g., chest pain, back pain, breathing difficulty, swelling, rash, fever, numbness, weakness)     Hx of back pain  Protocols used: LEG PAIN-A-AH

## 2018-01-10 NOTE — Telephone Encounter (Signed)
We need records of his visit to urgent care as I dont see it in the  record  .  Please advise patient to get these for his visit with us

## 2018-01-11 NOTE — Telephone Encounter (Signed)
Noted  

## 2018-01-11 NOTE — Telephone Encounter (Signed)
I spoke with pt and gave advice from Dr. Fabian SharpPanosh, pt agreed to bring a copy of Urgent care visit with him for appointment.

## 2018-01-12 ENCOUNTER — Ambulatory Visit: Payer: BLUE CROSS/BLUE SHIELD | Admitting: Internal Medicine

## 2018-01-30 NOTE — Progress Notes (Signed)
Chief Complaint  Patient presents with  . Follow-up    urgent care    HPI: Brandon Buckley 22 y.o. come in for  Gu concerns   Right scrotal test pain  That sometimes radiated to medial thigh  almost down to the knee   Went to   [facility]  2 weeks ago .   Records pending.    Did urine and blood test and no     Dx  .  Neg sti   rx of ?epididymitis   ?  10 days of medicine , got gouth was better and then  righ leg began to hurt .  Went away and then cam e  back aug 25 Sunday .  Only one partner no dc dysuria other   Skin rash itchy helped by rx topical steroid but out of this and left  Is still there an asks for refill  ROS: See pertinent positives and negatives per HPI.  Past Medical History:  Diagnosis Date  . ALLERGIC RHINITIS 03/06/2009   Qualifier: Diagnosis of  By: Brandon Buckley, Tera Mater   . 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 GYNECOMASTIA 03/06/2009   Qualifier: Diagnosis of  By: Brandon Buckley, Tera Mater   . Vascular headache 10/11/2011    Family History  Problem Relation Age of Onset  . Hypertension Unknown        both grandparents  . Diabetes type II Unknown        both grandparents.   . Stroke Unknown        ggm  . Migraines Mother     Social History   Socioeconomic History  . Marital status: Single    Spouse name: Not on file  . Number of children: Not on file  . Years of education: Not on file  . Highest education level: Not on file  Occupational History  . Not on file  Social Needs  . Financial resource strain: Not on file  . Food insecurity:    Worry: Not on file    Inability: Not on file  . Transportation needs:    Medical: Not on file    Non-medical: Not on file  Tobacco Use  . Smoking status: Never Smoker  . Smokeless tobacco: Never Used  Substance and Sexual Activity  . Alcohol use: No  . Drug use: Not on file  . Sexual activity: Not on file  Lifestyle  . Physical activity:    Days per week: Not on file    Minutes per session: Not on file  .  Stress: Not on file  Relationships  . Social connections:    Talks on phone: Not on file    Gets together: Not on file    Attends religious service: Not on file    Active member of club or organization: Not on file    Attends meetings of clubs or organizations: Not on file    Relationship status: Not on file  Other Topics Concern  . Not on file  Social History Narrative   hh of 5    3 dogs    No ets.      Work at [employer] and hours vary and then an evening job is moving       Psychologist, occupational    Outpatient Medications Prior to Visit  Medication Sig Dispense Refill  . triamcinolone cream (KENALOG) 0.1 % Apply 1 application topically 2 (two) times daily. Not  on face (Patient not taking: Reported on 03/09/2017) 30 g 1   No facility-administered medications prior to visit.      EXAM:  BP 133/87   Pulse 71   Wt 154 lb (69.9 kg)   BMI 22.42 kg/m   Body mass index is 22.42 kg/m.  GENERAL: vitals reviewed and listed above, alert, oriented, appears well hydrated and in no acute distress HEENT: atraumatic, conjunctiva  clear, no obvious abnormalities on inspection of external nose and ears   Ext gu standing no henri  No swelling or masses . No ob hernia  No rash   report area of chord and ? Posterior  test right no pain with elevation .   No mass   Nl gait  MS: moves all extremities without noticeable focal  Abnormality Skin left medial distal  Thickened patch  Eczematous?   No redness  PSYCH: pleasant and cooperative, no obvious depression or anxiety  BP Readings from Last 3 Encounters:  01/31/18 133/87  05/18/17 130/70  03/09/17 132/82   records pending  Will review when  Available   ASSESSMENT AND PLAN:  Discussed the following assessment and plan:  Perineal pain in male  Right testicular pain - rx for epiditimitis and had ne blood and sti tets but seemresponse to doxy  recnet recurrance of pain pat ask forrecourse reasonable    Dermatitis  Medication management Exam reassuring  To have records send  For us to review    If  persistent or progressive consider  uro consult   Patient thinks another course of antibiotic would help and sx not bad today     also refill steroid of  Rash  left leg medial  Supposed epididymitis was the dx but exam is reassuring  And pain radiates to almost to knee  Pain  Has to stand sometimes instead of sit  From discomfort .  Total visit 25mins > 50% spent counseling and coordinating care as indicated in above note and in instructions to patient .   -Patient advised to return or notify health care team  if  new concerns arise.  Patient Instructions  Records pending   Ok to try another course of meds But if recurrent  Then contact us consider seeing urologist .     Neta MendsWanda K. Johnwilliam Buckley M.D.

## 2018-01-31 ENCOUNTER — Ambulatory Visit (INDEPENDENT_AMBULATORY_CARE_PROVIDER_SITE_OTHER): Payer: BLUE CROSS/BLUE SHIELD | Admitting: Internal Medicine

## 2018-01-31 ENCOUNTER — Encounter: Payer: Self-pay | Admitting: Internal Medicine

## 2018-01-31 VITALS — BP 133/87 | HR 71 | Wt 154.0 lb

## 2018-01-31 DIAGNOSIS — R102 Pelvic and perineal pain: Secondary | ICD-10-CM | POA: Diagnosis not present

## 2018-01-31 DIAGNOSIS — L309 Dermatitis, unspecified: Secondary | ICD-10-CM | POA: Diagnosis not present

## 2018-01-31 DIAGNOSIS — Z79899 Other long term (current) drug therapy: Secondary | ICD-10-CM | POA: Diagnosis not present

## 2018-01-31 DIAGNOSIS — N50811 Right testicular pain: Secondary | ICD-10-CM

## 2018-01-31 MED ORDER — TRIAMCINOLONE ACETONIDE 0.1 % EX CREA: 1.0000 "application " | TOPICAL_CREAM | Freq: Two times a day (BID) | CUTANEOUS | 2 refills | Status: DC

## 2018-01-31 MED ORDER — DOXYCYCLINE HYCLATE 100 MG PO CAPS: 100.0000 mg | ORAL_CAPSULE | Freq: Two times a day (BID) | ORAL | 0 refills | Status: DC

## 2018-01-31 NOTE — Patient Instructions (Signed)
Records pending   Ok to try another course of meds But if recurrent  Then contact us consider seeing urologist .

## 2018-02-16 ENCOUNTER — Ambulatory Visit: Payer: BLUE CROSS/BLUE SHIELD | Admitting: Internal Medicine

## 2018-03-05 ENCOUNTER — Ambulatory Visit: Payer: Self-pay

## 2018-03-05 ENCOUNTER — Telehealth: Payer: Self-pay | Admitting: Internal Medicine

## 2018-03-05 NOTE — Telephone Encounter (Signed)
Copied from CRM [id number]. Topic: General - Other >> Mar 05, 2018 11:09 AM Gerrianne Scale wrote: Reason for CRM: pt calling stating that he would like a RX doxycycline (VIBRAMYCIN) 100 MG capsule of he states that the testicular pain came back >> Mar 05, 2018 11:13 AM Gerrianne Scale wrote: Pt want to know if Panosh think that he need to return to office or be referred to neurologist because the pain might be a nerve

## 2018-03-05 NOTE — Telephone Encounter (Signed)
Patient seen 01/31/18 for testicular pain and rx'd a second course of antiobiotics.  Per note: Ok to try another course of meds But if recurrent  Then contact us consider seeing urologist .   Would you recommend return OV or urology referral? Thanks!

## 2018-03-05 NOTE — Telephone Encounter (Signed)
Called patient. No answer. Mailbox full.

## 2018-03-05 NOTE — Telephone Encounter (Signed)
Pt states that 2 weeks prior to his appt on 01/31/18, he went to urgent care and was given doxycycline; he states that the pain came back; the pt also says that he did not take the 2nd dose of antibiotics per Dr Fabian Sharp; recommendations made per nurse triage protocol; he normally sees Dr Fabian Sharp but she has no availability per guidelines; pt offered and accepted appointment with Dr Clent Ridges, Georgena Spurling, 03/06/18 at 1445; he verbalizes understanding; will route to office for notification of this upcoming appointment; spoke with [facility] concerning this upcoming appointment.        Reason for Disposition . [1] Pain comes and goes (intermittent) AND [2] present > 24 hours  Answer Assessment - Initial Assessment Questions 1. LOCATION and RADIATION: "Where is the pain located?"      Right testicle 2. QUALITY: "What does the pain feel like?"  (e.g., sharp, dull, aching, burning)     sharp 3. SEVERITY: "How bad is the pain?"  (Scale 1-10; or mild, moderate, severe)   - MILD (1-3): doesn't interfere with normal activities    - MODERATE (4-7): interferes with normal activities (e.g., work or school) or awakens from sleep   - SEVERE (8-10): excruciating pain, unable to do any normal activities, difficulty walking     Moderate to severe 4. ONSET: "When did the pain start?"     01/17/18 5. PATTERN: "Does it come and go, or has it been constant since it started?"     Constant during the day when he is up 6. SCROTAL APPEARANCE: "What does the scrotum look like?" "Is there any swelling or redness?"      no 7. HERNIA: "Has a doctor ever told you that you have a hernia?"     no 8. OTHER SYMPTOMS: "Do you have any other symptoms?" (e.g., fever, abdominal pain, vomiting, difficulty passing urine)     Pain goes down groin into the knee (described as a "light pain"  Protocols used: SCROTAL PAIN-A-AH

## 2018-03-05 NOTE — Telephone Encounter (Signed)
Patient scheduled OV w/ Dr. Clent Ridges tomorrow.

## 2018-03-06 ENCOUNTER — Ambulatory Visit (INDEPENDENT_AMBULATORY_CARE_PROVIDER_SITE_OTHER): Payer: BLUE CROSS/BLUE SHIELD | Admitting: Family Medicine

## 2018-03-06 ENCOUNTER — Encounter: Payer: Self-pay | Admitting: Family Medicine

## 2018-03-06 VITALS — BP 140/100 | HR 68 | Temp 98.5°F | Wt 154.6 lb

## 2018-03-06 DIAGNOSIS — N451 Epididymitis: Secondary | ICD-10-CM

## 2018-03-06 MED ORDER — DOXYCYCLINE HYCLATE 100 MG PO CAPS: 100.0000 mg | ORAL_CAPSULE | Freq: Two times a day (BID) | ORAL | 0 refills | Status: DC

## 2018-03-06 NOTE — Progress Notes (Signed)
   Subjective:    Patient ID: Brandon Buckley, male    DOB: 02/25/1996, 22 y.o.   MRN: [id number]  HPI Here for recurrent right groin and testicle pain. This started about 6 weeks ago and he felt mild pain in this area. No hx of trauma. No urethral DC. No burning on urination. He saw urgent care and had a battery of STD tests done, all of which were negative. He took Doxycycline for 10 days and the pain went away. Then it returned and he saw Dr. Fabian Sharp on 01-31-18. He was prescribed another round of Doxycycline, but he never got this filled. He felt better for a few weeks, but now the discomfort has returned. He has more discomfort when up walking. He feels fine when sitting or lying down.   Review of Systems  Constitutional: Negative.   Respiratory: Negative.   Cardiovascular: Negative.   Gastrointestinal: Negative.   Genitourinary: Positive for testicular pain. Negative for discharge and scrotal swelling.       Objective:   Physical Exam  Constitutional: He appears well-developed and well-nourished.  Cardiovascular: Normal rate, regular rhythm, normal heart sounds and intact distal pulses.  Pulmonary/Chest: Effort normal and breath sounds normal.  Genitourinary:  Genitourinary Comments: The right groin is not tender. No hernias. He is mildly tender in the right epididymus but not the testicle. No swelling           Assessment & Plan:  Epididymitis. Treat with 14 days of Doxycycline. Recheck prn. Gershon Crane, MD

## 2018-03-20 ENCOUNTER — Telehealth: Payer: Self-pay | Admitting: *Deleted

## 2018-03-20 DIAGNOSIS — N451 Epididymitis: Secondary | ICD-10-CM

## 2018-03-20 NOTE — Telephone Encounter (Signed)
Please advise Dr Panosh, thanks.   

## 2018-03-20 NOTE — Telephone Encounter (Signed)
Copied from CRM [id number]. Topic: Referral - Request for Referral >> Mar 20, 2018 12:08 PM Arlyss Gandy, NT wrote: Has patient seen PCP for this complaint? Yes.   *If NO, is insurance requiring patient see PCP for this issue before PCP can refer them? Referral for which specialty: Urology Preferred provider/office: [facility] on [facility] Reason for referral: Epididymitis

## 2018-03-21 NOTE — Telephone Encounter (Signed)
Patient wants to know if he can get more doxycycline (VIBRAMYCIN) 100 MG capsule until his referral is placed? Has 3 days worth left but pain is not much better. Please call back to advise.

## 2018-03-21 NOTE — Telephone Encounter (Signed)
Will send to Dr Fabian Sharp to address tomorrow AM 03/22/18

## 2018-03-22 NOTE — Telephone Encounter (Signed)
Ok to refill the doxycycline x 1 and get urology to see him for this problem

## 2018-03-23 MED ORDER — DOXYCYCLINE HYCLATE 100 MG PO CAPS: 100.0000 mg | ORAL_CAPSULE | Freq: Two times a day (BID) | ORAL | 0 refills | Status: DC

## 2018-03-23 NOTE — Telephone Encounter (Signed)
Rx called in Pt aware. Referral placed as well. Nothing further needed.

## 2018-04-02 ENCOUNTER — Other Ambulatory Visit: Payer: Self-pay | Admitting: Urology

## 2018-04-02 DIAGNOSIS — N50811 Right testicular pain: Secondary | ICD-10-CM

## 2018-04-05 ENCOUNTER — Ambulatory Visit
Admission: RE | Admit: 2018-04-05 | Discharge: 2018-04-05 | Disposition: A | Discharge status: Home / Self Care | Payer: BLUE CROSS/BLUE SHIELD | Source: Ambulatory Visit | Attending: Urology | Admitting: Urology

## 2018-04-05 DIAGNOSIS — N50811 Right testicular pain: Secondary | ICD-10-CM | POA: Diagnosis not present

## 2018-05-08 ENCOUNTER — Ambulatory Visit: Payer: Self-pay | Admitting: *Deleted

## 2018-05-08 DIAGNOSIS — N50811 Right testicular pain: Secondary | ICD-10-CM | POA: Diagnosis not present

## 2018-05-08 NOTE — Telephone Encounter (Signed)
  Patient is calling to report he is having pain in his testicle- he has seen and been treated by urology and he is requesting antibiotic and pain medication. Recommend that patient contact his urologist for acute appointment to be checked and evaluated so he can be treated. We can not treat him without knowing what we are treating. He will call them for appointment and will call back if he has trouble getting an acute appointment. Reason for Disposition . [1] Constant pain in scrotum or testicle AND [2] present > 1 hour    Patient has established with urologist for this problem and he has been treated for this in the past- Call to office- also agree he needs to contact them for acute appointment.  Answer Assessment - Initial Assessment Questions 1. LOCATION and RADIATION: "Where is the pain located?"      R testicle 2. QUALITY: "What does the pain feel like?"  (e.g., sharp, dull, aching, burning)     Aching pain- in the beginning it was burning and sharpe and down leg- now constant ache 3. SEVERITY: "How bad is the pain?"  (Scale 1-10; or mild, moderate, severe)   - MILD (1-3): doesn't interfere with normal activities    - MODERATE (4-7): interferes with normal activities (e.g., work or school) or awakens from sleep   - SEVERE (8-10): excruciating pain, unable to do any normal activities, difficulty walking     6-7- last night 8-9 4. ONSET: "When did the pain start?"     Ongoing since July- patient has had several treatments 5. PATTERN: "Does it come and go, or has it been constant since it started?"     Constant- might go out of mind- but does not go away 6. SCROTAL APPEARANCE: "What does the scrotum look like?" "Is there any swelling or redness?"      No change in appearance 7. HERNIA: "Has a doctor ever told you that you have a hernia?"     no 8. OTHER SYMPTOMS: "Do you have any other symptoms?" (e.g., fever, abdominal pain, vomiting, difficulty passing urine)     no  Protocols used:  SCROTAL PAIN-A-AH

## 2018-05-09 NOTE — Telephone Encounter (Signed)
This has been an ongoing problem and   Best that urology should  Help with his problem

## 2018-05-09 NOTE — Telephone Encounter (Signed)
Will send to Dr Fabian SharpPanosh as Lorain ChildesFYI Pt to call back if unable to get in with Urology

## 2018-05-15 DIAGNOSIS — M6281 Muscle weakness (generalized): Secondary | ICD-10-CM | POA: Diagnosis not present

## 2018-05-15 DIAGNOSIS — R102 Pelvic and perineal pain: Secondary | ICD-10-CM | POA: Diagnosis not present

## 2018-05-15 DIAGNOSIS — M62838 Other muscle spasm: Secondary | ICD-10-CM | POA: Diagnosis not present

## 2018-05-22 DIAGNOSIS — R102 Pelvic and perineal pain: Secondary | ICD-10-CM | POA: Diagnosis not present

## 2018-05-22 DIAGNOSIS — M6281 Muscle weakness (generalized): Secondary | ICD-10-CM | POA: Diagnosis not present

## 2018-05-22 DIAGNOSIS — M62838 Other muscle spasm: Secondary | ICD-10-CM | POA: Diagnosis not present

## 2018-05-22 DIAGNOSIS — N50811 Right testicular pain: Secondary | ICD-10-CM | POA: Diagnosis not present

## 2018-09-08 ENCOUNTER — Telehealth: Payer: BLUE CROSS/BLUE SHIELD | Admitting: Gastroenterology

## 2018-09-08 DIAGNOSIS — R12 Heartburn: Secondary | ICD-10-CM | POA: Diagnosis not present

## 2018-09-08 MED ORDER — DEXLANSOPRAZOLE 60 MG PO CPDR: 60.0000 mg | DELAYED_RELEASE_CAPSULE | Freq: Every day | ORAL | 0 refills | Status: DC

## 2018-09-08 NOTE — Progress Notes (Signed)
We are sorry that you are not feeling well.  Here is how we plan to help!  Based on what you shared with me it looks like you most likely have Gastroesophageal Reflux Disease (GERD)  Gastroesophageal reflux disease (GERD) happens when acid from your stomach flows up into the esophagus.  When acid comes in contact with the esophagus, the acid causes sorenss (inflammation) in the esophagus.  Over time, GERD may create small holes (ulcers) in the lining of the esophagus.  I recommend trying Dexilant 60mg  daily, 30 minutes before breakfast daily.   You need to follow up with your PCP ASAP for better management of your symptoms.    Your symptoms should improve in the next day or two.  You can use antacids as needed until symptoms resolve.  Call us if your heartburn worsens, you have trouble swallowing, weight loss, spitting up blood or recurrent vomiting.  Home Care:  May include lifestyle changes such as weight loss, quitting smoking and alcohol consumption  Avoid foods and drinks that make your symptoms worse, such as:  Caffeine or alcoholic drinks  Chocolate  Peppermint or mint flavorings  Garlic and onions  Spicy foods  Citrus fruits, such as oranges, lemons, or limes  Tomato-based foods such as sauce, chili, salsa and pizza  Fried and fatty foods  Avoid lying down for 3 hours prior to your bedtime or prior to taking a nap  Eat small, frequent meals instead of a large meals  Wear loose-fitting clothing.  Do not wear anything tight around your waist that causes pressure on your stomach.  Raise the head of your bed 6 to 8 inches with wood blocks to help you sleep.  Extra pillows will not help.  Seek Help Right Away If:  You have pain in your arms, neck, jaw, teeth or back  Your pain increases or changes in intensity or duration  You develop nausea, vomiting or sweating (diaphoresis)  You develop shortness of breath or you faint  Your vomit is green, yellow, black or  looks like coffee grounds or blood  Your stool is red, bloody or black  These symptoms could be signs of other problems, such as heart disease, gastric bleeding or esophageal bleeding.  Make sure you :  Understand these instructions.  Will watch your condition.  Will get help right away if you are not doing well or get worse.  Your e-visit answers were reviewed by a board certified advanced clinical practitioner to complete your personal care plan.  Depending on the condition, your plan could have included both over the counter or prescription medications.  If there is a problem please reply  once you have received a response from your provider.  Your safety is important to [place].  If you have drug allergies check your prescription carefully.    You can use MyChart to ask questions about today's visit, request a non-urgent call back, or ask for a work or school excuse for 24 hours related to this e-Visit. If it has been greater than 24 hours you will need to follow up with your provider, or enter a new e-Visit to address those concerns.  You will get an e-mail in the next two days asking about your experience.  I hope that your e-visit has been valuable and will speed your recovery. Thank you for using e-visits.

## 2018-09-11 ENCOUNTER — Telehealth: Payer: Self-pay | Admitting: *Deleted

## 2018-09-11 NOTE — Telephone Encounter (Signed)
Copied from CRM [id number]. Topic: General - Inquiry >> Sep 10, 2018 12:06 PM Brandon Buckley wrote: Reason for CRM: pt did an e visit for heartburn/gerd.  Pt was prescribed dexlansoprazole (DEXILANT) 60 MG capsule. Pt states this med was $ 287.00. pt states he has tried Prilosec, Nexium, pt states everything out there and nothing has helped. Would like to know what dr Fabian Sharp thinks about this? Is their anything else she could recommend. Should he schedule a visit with Dr Fabian Sharp.

## 2018-09-12 NOTE — Telephone Encounter (Signed)
Pt calling back to follow up on this message.  Pt state should he try the dexilant?  If so, a coupon may pay for a 90 day, and he only got a 30 day prescribed in th e visit, and has not picked up due to the high cost. Would like to know what Dr Fabian Sharp thinks. Please advise

## 2018-09-12 NOTE — Telephone Encounter (Signed)
This was prescribed over an e visit mychart, pt does not even know how to get up with that dr. Rock Nephew states maybe best to schedule a virtual visit to discuss his symptoms. Pt wants to discuss with Dr Fabian Sharp.

## 2018-09-12 NOTE — Telephone Encounter (Signed)
I dodnt prescribe this medication    Advise  he have the pre scriber   Answer this message.

## 2018-09-17 ENCOUNTER — Other Ambulatory Visit: Payer: Self-pay

## 2018-09-17 ENCOUNTER — Ambulatory Visit (INDEPENDENT_AMBULATORY_CARE_PROVIDER_SITE_OTHER): Payer: BLUE CROSS/BLUE SHIELD | Admitting: Internal Medicine

## 2018-09-17 ENCOUNTER — Encounter: Payer: Self-pay | Admitting: Internal Medicine

## 2018-09-17 DIAGNOSIS — K219 Gastro-esophageal reflux disease without esophagitis: Secondary | ICD-10-CM

## 2018-09-17 DIAGNOSIS — R196 Halitosis: Secondary | ICD-10-CM | POA: Diagnosis not present

## 2018-09-17 MED ORDER — PANTOPRAZOLE SODIUM 40 MG PO TBEC: 40.0000 mg | DELAYED_RELEASE_TABLET | Freq: Every day | ORAL | 1 refills | Status: DC

## 2018-09-17 NOTE — Progress Notes (Signed)
Virtual Visit via Video Note  I connected with@ on 09/17/18 at  1:15 PM EDT by a video enabled telemedicine application and verified that I am speaking with the correct person using two identifiers. Location patient: home Location provider:work o office Persons participating in the virtual visit: patient, provider  W[place]th national recommendations  regarding COVID 19 pandemic   video visit is advised over in office visit for this patient.  Discussed the limitations of evaluation and management by telemedicine and  availability of in person appointments. The agreed to proceed.   HPI: Brandon Buckley Pt requested  rx   From e visit  ( from non pcp provider)    On APril 4th  Had tried tums nexium pepcid prolosec  And thus  Talk with pcp and plan fu   dexilant  but high cost with ihg deductible and wishes input from pcp  Onset for a few months  And used  pepcid bid ofr a month with omeprazole prev nexium etc   And prn tums  For mid chest burning without cough throat clearing and at first was   When lay down at night  Acid sx coming up in mouth   Not now but now has bad odor in mouth despite brushing and tongue hygiene but no burping fever sinus issues  Trying chewing gum with limited success  No dysphagia? although had  Pasta  recently with sauce and had to take water to get  Food down.  ROS: See pertinent positives and negatives per HPI. No tob some inhalants etoh on weekends after new year but non t now . n No recent antibiotic    Since dec 2019  Past Medical History:  Diagnosis Date  . ALLERGIC RHINITIS 03/06/2009   Qualifier: Diagnosis of  By: Clent Ridges MD, Tera Mater   . 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 GYNECOMASTIA 03/06/2009   Qualifier: Diagnosis of  By: Clent Ridges MD, Tera Mater   . Vascular headache 10/11/2011    History reviewed. No pertinent surgical history.  Family History  Problem Relation Age of Onset  . Hypertension Unknown        both grandparents  . Diabetes type II Unknown        both grandparents.   . Stroke Unknown        ggm  . Migraines Mother     SOCIAL HX:  Neg t limited etoh d  Is in [place]   Current Outpatient Medications:  .  dexlansoprazole (DEXILANT) 60 MG capsule, Take 1 capsule (60 mg total) by mouth daily before breakfast., Disp: 30 capsule, Rfl: 0 .  pantoprazole (PROTONIX) 40 MG tablet, Take 1 tablet (40 mg total) by mouth daily., Disp: 30 tablet, Rfl: 1 .  triamcinolone cream (KENALOG) 0.1 %, Apply 1 application topically 2 (two) times daily. Not on face, Disp: 30 g, Rfl: 2  EXAM:  VITALS per patient if applicable:  GENERAL: alert, oriented, appears well and in no acute distress  HEENT: atraumatic, conjunttiva clear, no obvious abnormalities on inspection of external nose and ears  NECK: normal movements of the head and neck  LUNGS: on inspection no signs of respiratory distress, breathing rate appears normal, no obvious gross SOB, gasping or wheezing  CV: no obvious cyanosis  MS: moves all visible extremities without noticeable abnormality  PSYCH/NEURO: pleasant and cooperative, no obvious depression or anxiety, speech and thought processing grossly intact  ASSESSMENT AND PLAN:  Discussed the following assessment and plan:  Gastroesophageal reflux  disease without esophagitis  Breath odor  Disc dietary changes  To  gen protonix and lsi  And rov in about a month  Video or ov but if no help in 2 weeks then   Contact us   Trial protonix   Can try good rx  Also  And then fu   Advised may take weeks to help    Expectant management and discussion of plan and treatment with patient with opportunity to ask questions and all were answered. The patient agreed with the plan and demonstrated an understanding of the instructions.   The patient was advised to call back or seek an in-person evaluation if worsening having concerns    or if the condition fails to improve as anticipated. 2 weeks    Berniece AndreasWanda Panosh, MD

## 2018-10-02 ENCOUNTER — Telehealth: Payer: Self-pay | Admitting: Internal Medicine

## 2018-10-02 MED ORDER — PANTOPRAZOLE SODIUM 40 MG PO TBEC: 40.0000 mg | DELAYED_RELEASE_TABLET | Freq: Two times a day (BID) | ORAL | 1 refills | Status: DC

## 2018-10-02 MED ORDER — DEXLANSOPRAZOLE 60 MG PO CPDR: 60.0000 mg | DELAYED_RELEASE_CAPSULE | Freq: Every day | ORAL | 0 refills | Status: DC

## 2018-10-02 NOTE — Telephone Encounter (Signed)
Copied from CRM [id number]. Topic: Quick Communication - See Telephone Encounter >> Oct 02, 2018 10:21 AM Fanny Bien wrote: CRM for notification. See Telephone encounter for: 10/02/18. Pt called and stated that he was seen on 09/17/18 for heartburn and symptoms have not improved. Pt would like a call back regarding. Please advise

## 2018-10-02 NOTE — Telephone Encounter (Signed)
Pt will call back to schedule follow up

## 2018-10-02 NOTE — Telephone Encounter (Signed)
Ok  to [employer] instead  And VV in 20- 3 week.

## 2018-10-02 NOTE — Telephone Encounter (Signed)
Patient called back and was informed of the message below.  Patient wanted to ask Dr Fabian Sharp if she could send in the Rx for Dexilant in addition to Protonix if she feels this would work better?  Patient stated he does not mind paying the cost for Dexilant.  Message sent to Dr Rosezella Florida asst.

## 2018-10-02 NOTE — Telephone Encounter (Signed)
I would like  Him to increase the  Protonix   Twice a day  ( please send in a refill for  #60 so he has enough   rx  Refill  ) then Video visit or fu in another 2   -3 weeks  We may need to have him see the GI specialist if not getting better

## 2018-10-09 ENCOUNTER — Telehealth: Payer: Self-pay

## 2018-10-09 DIAGNOSIS — K219 Gastro-esophageal reflux disease without esophagitis: Secondary | ICD-10-CM

## 2018-10-09 NOTE — Telephone Encounter (Signed)
Copied from CRM [id number]. Topic: Referral - Request for Referral >> Oct 09, 2018 12:23 PM Jay Schlichter wrote: Has patient seen PCP for this complaint? Yes.   *If NO, is insurance requiring patient see PCP for this issue before PCP can refer them? Referral for which specialty: gastroenterology  Preferred provider/office: [facility]  Reason for referral: gerd, heartburn, no improvement  Cb is [phone]

## 2018-10-09 NOTE — Telephone Encounter (Signed)
Please place referral  As requested   Dx gerd sx unresponsive to medication

## 2018-10-09 NOTE — Telephone Encounter (Signed)
PA for pantoprazole (PROTONIX) 40 MG tablet sent to cover my meds.    KeyAlgis Downs - PA Case ID: [id number]

## 2018-10-10 NOTE — Telephone Encounter (Signed)
Referral has been placed. 

## 2018-10-10 NOTE — Telephone Encounter (Signed)
PA has been approved

## 2018-10-11 NOTE — Telephone Encounter (Signed)
lvm letting pt know

## 2018-10-15 ENCOUNTER — Other Ambulatory Visit: Payer: Self-pay

## 2018-10-15 ENCOUNTER — Ambulatory Visit (INDEPENDENT_AMBULATORY_CARE_PROVIDER_SITE_OTHER): Payer: BLUE CROSS/BLUE SHIELD | Admitting: Gastroenterology

## 2018-10-15 ENCOUNTER — Encounter: Payer: Self-pay | Admitting: Gastroenterology

## 2018-10-15 VITALS — Ht 70.5 in | Wt 150.0 lb

## 2018-10-15 DIAGNOSIS — R438 Other disturbances of smell and taste: Secondary | ICD-10-CM | POA: Diagnosis not present

## 2018-10-15 DIAGNOSIS — K219 Gastro-esophageal reflux disease without esophagitis: Secondary | ICD-10-CM

## 2018-10-15 NOTE — Patient Instructions (Addendum)
Continue pantoprazole twice daily 30 minutes before breakfast and dinner.  You have been scheduled for an endoscopy. Please follow written instructions given to you at your visit today. If you use inhalers (even only as needed), please bring them with you on the day of your procedure. Your physician has requested that you go to www.startemmi.com and enter the access code given to you at your visit today. This web site gives a general overview about your procedure. However, you should still follow specific instructions given to you by our office regarding your preparation for the procedure.  Thank you for choosing me and [facility].  Venita Lick. Pleas Koch., MD., Clementeen Graham

## 2018-10-15 NOTE — Progress Notes (Signed)
History of Present Illness: This is a 23 year old male referred by Panosh, Neta MendsWanda K, MD for the evaluation of GERD and a bad taste in his mouth.  He notes in January he developed a funny taste in his mouth that was not relieved with brushing.  In February he noted intermittent pain in his mid chest which occasionally was a burning type sensation.  He has also noted intermittent mid back pain. He assumed this was acid reflux and tried over-the-counter Pepcid, Nexium and Prilosec with only minimal help. He was recommended to try Dexilant however it was expensive so he did not proceed.  He was evaluated by his PCP and placed on pantoprazole 40 mg daily which provided some help but his symptoms persisted.  Last week he was advised to increase his pantoprazole to twice daily which she did for 4 days however his prescription ran out and he plans to get a refill today.  He relates occasional difficulty swallowing water. He feels water is going down slowly. He had one occasion with difficulty swallowing spaghetti.  No other solid food dysphagia since that episode. Denies weight loss, abdominal pain, constipation, diarrhea, change in stool caliber, melena, hematochezia, nausea, vomiting.     No Known Allergies    Medications: Triamcinolone 0.1% bid topically Pantoprazole 40 mg po bid (prescription ran out 2 days ago)    Past Medical History:  Diagnosis Date  . ALLERGIC RHINITIS 03/06/2009   Qualifier: Diagnosis of  By: Clent RidgesFry MD, Tera MaterStephen A   . 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 GYNECOMASTIA 03/06/2009   Qualifier: Diagnosis of  By: Clent RidgesFry MD, Tera MaterStephen A   . Vascular headache 10/11/2011   Past Surgical History:  Procedure Laterality Date  . WISDOM TOOTH EXTRACTION     Social History   Socioeconomic History  . Marital status: Single    Spouse name: Not on file  . Number of children: Not on file  . Years of education: Not on file  . Highest education level: Not on file  Occupational History  .  Not on file  Social Needs  . Financial resource strain: Not on file  . Food insecurity:    Worry: Not on file    Inability: Not on file  . Transportation needs:    Medical: Not on file    Non-medical: Not on file  Tobacco Use  . Smoking status: Never Smoker  . Smokeless tobacco: Never Used  Substance and Sexual Activity  . Alcohol use: No  . Drug use: Not on file  . Sexual activity: Not on file  Lifestyle  . Physical activity:    Days per week: Not on file    Minutes per session: Not on file  . Stress: Not on file  Relationships  . Social connections:    Talks on phone: Not on file    Gets together: Not on file    Attends religious service: Not on file    Active member of club or organization: Not on file    Attends meetings of clubs or organizations: Not on file    Relationship status: Not on file  Other Topics Concern  . Not on file  Social History Narrative   hh of 5    3 dogs    No ets.      Work at [employer] and hours vary and then an evening job is moving       Psychologist, occupational[employer]   [employer]ing [employer]  Family History  Problem Relation Age of Onset  . Hypertension Other        both grandparents  . Diabetes type II Other        both grandparents.   . Stroke Other        ggm  . Migraines Mother        Review of Systems: Pertinent positive and negative review of systems were noted in the above HPI section. All other review of systems were otherwise negative.    Physical Exam: Telemedicne - not performed   Assessment and Recommendations:  1.  Suspected GERD.  Rule out esophagitis, ulcer, gastritis, stricture.  Intermittent mild difficulty swallowing liquids and difficulty swallowing spaghetti on one occasion.  Continue pantoprazole 40 mg twice daily taken 30 minutes before breakfast and dinner.  Closely follow antireflux measures.  Schedule EGD in about 2 - 3 weeks.  Patient states if his symptoms improve dramatically he may want to cancel the EGD which is  reasonable provided he informs [place] several business days in advance. The risks (including bleeding, perforation, infection, missed lesions, medication reactions and possible hospitalization or surgery if complications occur), benefits, and alternatives to endoscopy with possible biopsy and possible dilation were discussed with the patient and they consent to proceed.   2.  Bad taste in mouth.  This is probably not connected to acid reflux and have recommended his PCP further evaluate.   These services were provided via telemedicine, audio and video.  The patient was at home and the provider was in the office, alone.  We discussed the limitations of evaluation and management by telemedicine and the availability of in person appointments.  Patient consented for this telemedicine visit and is aware of possible charges for this service.  The other person participating in the telemedicine service was Christie Nottingham, CMA who reviewed medications, allergies, past history and completed AVS.  Time spent on call: 18 minutes     cc: Panosh, Neta Mends, MD [address] [place], [place] [address]

## 2018-11-07 ENCOUNTER — Telehealth: Payer: Self-pay | Admitting: *Deleted

## 2018-11-07 NOTE — Telephone Encounter (Signed)
Patient called back and acknowledge the message and had not further questions. He answered no to all the travel questions for the covid questions.

## 2018-11-07 NOTE — Telephone Encounter (Signed)
LMOM

## 2018-11-07 NOTE — Telephone Encounter (Signed)
Unable to reach patient. LMOM reminding of procedure arrival time.  Also, asked pt to wear a mask into the building and informed him that care partner will be waiting in the car during procedure- reminded that care partner will need to stay in parking lot during procedure.   If they feel like they will be too hot waiting in the car; they may wait in the lobby.  We want them to wear a mask (we do not have any that we can provide them), practice social distancing, and we will check their temperatures when they get here.   Asked patient to call back if any of the following is a yes- travel, fever or any respiratory issues in the past 14 days, or have had any close contacts/family members diagnosed with the covid 19.  

## 2018-11-09 ENCOUNTER — Ambulatory Visit (AMBULATORY_SURGERY_CENTER): Payer: BC Managed Care – PPO | Admitting: Gastroenterology

## 2018-11-09 ENCOUNTER — Other Ambulatory Visit: Payer: Self-pay

## 2018-11-09 ENCOUNTER — Encounter: Payer: Self-pay | Admitting: Gastroenterology

## 2018-11-09 VITALS — BP 143/87 | HR 65 | Temp 99.0°F | Resp 20 | Ht 70.5 in | Wt 150.0 lb

## 2018-11-09 DIAGNOSIS — K449 Diaphragmatic hernia without obstruction or gangrene: Secondary | ICD-10-CM

## 2018-11-09 DIAGNOSIS — K219 Gastro-esophageal reflux disease without esophagitis: Secondary | ICD-10-CM | POA: Diagnosis not present

## 2018-11-09 MED ORDER — SODIUM CHLORIDE 0.9 % IV SOLN: 500.0000 mL | Freq: Once | INTRAVENOUS | Status: DC

## 2018-11-09 NOTE — Op Note (Signed)
[facility] Patient Name: Brandon Buckley Procedure Date: 11/09/2018 4:16 PM MRN: [id number] Endoscopist: Meryl DareMalcolm T Johnice Riebe , MD Age: [age] Referring MD:  Date of Birth: Oct 15, 1995 Gender: Male Account #: [id number] Procedure:                Upper GI endoscopy Indications:              Gastroesophageal reflux disease Medicines:                Monitored Anesthesia Care Procedure:                Pre-Anesthesia Assessment:                           - Prior to the procedure, a History and Physical                            was performed, and patient medications and                            allergies were reviewed. The patient's tolerance of                            previous anesthesia was also reviewed. The risks                            and benefits of the procedure and the sedation                            options and risks were discussed with the patient.                            All questions were answered, and informed consent                            was obtained. Prior Anticoagulants: The patient has                            taken no previous anticoagulant or antiplatelet                            agents. ASA Grade Assessment: I - A normal, healthy                            patient. After reviewing the risks and benefits,                            the patient was deemed in satisfactory condition to                            undergo the procedure.                           After obtaining informed consent, the endoscope was  passed under direct vision. Throughout the                            procedure, the patient's blood pressure, pulse, and                            oxygen saturations were monitored continuously. The                            Endoscope was introduced through the mouth, and                            advanced to the second part of duodenum. The upper                            GI endoscopy was accomplished without  difficulty.                            The patient tolerated the procedure well. Scope In: Scope Out: Findings:                 The examined esophagus was normal.                           A small hiatal hernia was present.                           The exam of the stomach was otherwise normal.                           The duodenal bulb and second portion of the                            duodenum were normal. Complications:            No immediate complications. Estimated Blood Loss:     Estimated blood loss: none. Impression:               - Normal esophagus.                           - Small hiatal hernia.                           - Normal duodenal bulb and second portion of the                            duodenum.                           - No specimens collected. Recommendation:           - Patient has a contact number available for                            emergencies. The signs and symptoms of potential  delayed complications were discussed with the                            patient. Return to normal activities tomorrow.                            Written discharge instructions were provided to the                            patient.                           - Resume previous diet.                           - Antireflux measures.                           - Continue present medications. Meryl Dare, MD 11/09/2018 4:41:27 PM This report has been signed electronically.

## 2018-11-09 NOTE — Patient Instructions (Signed)
Impression/Recommendations:  Hiatal hernia handout given to patient. Antireflux measures included in handout given to patient.  Resume previous diet.  Continue present medications.  YOU HAD AN ENDOSCOPIC PROCEDURE TODAY AT THE [facility]:   Refer to the procedure report that was given to you for any specific questions about what was found during the examination.  If the procedure report does not answer your questions, please call your gastroenterologist to clarify.  If you requested that your care partner not be given the details of your procedure findings, then the procedure report has been included in a sealed envelope for you to review at your convenience later.  YOU SHOULD EXPECT: Some feelings of bloating in the abdomen. Passage of more gas than usual.  Walking can help get rid of the air that was put into your GI tract during the procedure and reduce the bloating. If you had a lower endoscopy (such as a colonoscopy or flexible sigmoidoscopy) you may notice spotting of blood in your stool or on the toilet paper. If you underwent a bowel prep for your procedure, you may not have a normal bowel movement for a few days.  Please Note:  You might notice some irritation and congestion in your nose or some drainage.  This is from the oxygen used during your procedure.  There is no need for concern and it should clear up in a day or so.  SYMPTOMS TO REPORT IMMEDIATELY:   Following upper endoscopy (EGD)  Vomiting of blood or coffee ground material  New chest pain or pain under the shoulder blades  Painful or persistently difficult swallowing  New shortness of breath  Fever of 100F or higher  Black, tarry-looking stools  For urgent or emergent issues, a gastroenterologist can be reached at any hour by calling [phone].   DIET:  We do recommend a small meal at first, but then you may proceed to your regular diet.  Drink plenty of fluids but you should avoid alcoholic  beverages for 24 hours.  ACTIVITY:  You should plan to take it easy for the rest of today and you should NOT DRIVE or use heavy machinery until tomorrow (because of the sedation medicines used during the test).    FOLLOW UP: Our staff will call the number listed on your records 48-72 hours following your procedure to check on you and address any questions or concerns that you may have regarding the information given to you following your procedure. If we do not reach you, we will leave a message.  We will attempt to reach you two times.  During this call, we will ask if you have developed any symptoms of COVID 19. If you develop any symptoms (ie: fever, flu-like symptoms, shortness of breath, cough etc.) before then, please call [phone].  If you test positive for Covid 19 in the 2 weeks post procedure, please call and report this information to [place].    If any biopsies were taken you will be contacted by phone or by letter within the next 1-3 weeks.  Please call us at [phone] if you have not heard about the biopsies in 3 weeks.    SIGNATURES/CONFIDENTIALITY: You and/or your care partner have signed paperwork which will be entered into your electronic medical record.  These signatures attest to the fact that that the information above on your After Visit Summary has been reviewed and is understood.  Full responsibility of the confidentiality of this discharge information lies with you and/or  your care-partner. 

## 2018-11-09 NOTE — Progress Notes (Signed)
A/ox3, pleased with MAC, report to RN 

## 2018-11-13 ENCOUNTER — Telehealth: Payer: Self-pay

## 2018-11-13 DIAGNOSIS — K219 Gastro-esophageal reflux disease without esophagitis: Secondary | ICD-10-CM

## 2018-11-13 NOTE — Telephone Encounter (Signed)
No answer, left message to call back later today, B.Homero Hyson RN. 

## 2018-11-13 NOTE — Telephone Encounter (Signed)
No answer, left message to call if having any issues or concerns, B.Jamirra Curnow RN 

## 2018-11-13 NOTE — Telephone Encounter (Signed)
Pt stated that he feels the same as he did pre procedure. He would like to know what his next step is.

## 2018-11-14 MED ORDER — PANTOPRAZOLE SODIUM 40 MG PO TBEC: 40.0000 mg | DELAYED_RELEASE_TABLET | Freq: Two times a day (BID) | ORAL | 3 refills | Status: DC

## 2018-11-14 NOTE — Addendum Note (Signed)
Addended by: Delos Haring on: 11/14/2018 03:19 PM   Modules accepted: Orders

## 2018-11-14 NOTE — Telephone Encounter (Signed)
Spoke with patient and Dr. Fuller Plan. Dr. Fuller Plan advises pt to follow recommendations after procedure. Pt states he has run out of medications. Verbal order from Dr. Fuller Plan for Pantoprazole 40 mg BID, #90 with 3 refills. Sent to pharmacy in [place] per pt request. If no improvement in 4-6 weeks pt may follow through with PCP or Dr. Fuller Plan in the office.

## 2018-11-14 NOTE — Telephone Encounter (Signed)
Pt returned call

## 2018-11-15 NOTE — Telephone Encounter (Signed)
Pt requested prescription for pantoprazole to be sent to [facility] on [address] in [place].  Pharmacy in [place] is out of network.

## 2018-12-31 ENCOUNTER — Telehealth: Payer: Self-pay | Admitting: Gastroenterology

## 2018-12-31 MED ORDER — DEXILANT 60 MG PO CPDR: 60.0000 mg | DELAYED_RELEASE_CAPSULE | Freq: Every day | ORAL | 1 refills | Status: DC

## 2018-12-31 NOTE — Telephone Encounter (Signed)
Patient states he still having some chest pain and break through reflux symptoms but it's not as frequent since taking the pantoprazole.  Patient states his main concern is this bad taste in his mouth. Patient states he cannot describe the taste and it has improved some but wanted to see if there is any further tests. Informed patient his EGD was relatively normal showing reflux and a small hiatal hernia. Informed patient that there is not further testing for this in particular and should go back to PCP to be evaluated. Patient states he was going to be put on a different reflux medication prior to his visit with Dr. Fuller Plan and wonders if this might help. Informed patient we can try to switch his PPI to see if his reflux gets better. Patient states the medication was called Dexilant and would like for [place] to send that in for him to try. Informed patient I will send to his pharmacy. Patient is out of town and requests we send it to a [facility] in [place].

## 2018-12-31 NOTE — Telephone Encounter (Signed)
Pt reported that pantoprazole is "kind of helping" and would like to discuss plan of care.

## 2019-01-16 ENCOUNTER — Telehealth: Payer: Self-pay | Admitting: Gastroenterology

## 2019-01-16 NOTE — Telephone Encounter (Signed)
Patient is advised the Dr, Fuller Plan did not feel his "bad taste' was related to GERD and he is asked to speak with his PCP or his dentist. All of his questions answered.  He will call back for additional questions or concerns.

## 2019-01-17 ENCOUNTER — Other Ambulatory Visit: Payer: Self-pay

## 2019-01-17 ENCOUNTER — Ambulatory Visit: Payer: Self-pay

## 2019-01-17 ENCOUNTER — Telehealth (INDEPENDENT_AMBULATORY_CARE_PROVIDER_SITE_OTHER): Payer: BC Managed Care – PPO | Admitting: Family Medicine

## 2019-01-17 ENCOUNTER — Encounter: Payer: Self-pay | Admitting: Family Medicine

## 2019-01-17 DIAGNOSIS — R438 Other disturbances of smell and taste: Secondary | ICD-10-CM

## 2019-01-17 MED ORDER — FLUCONAZOLE 100 MG PO TABS: 100.0000 mg | ORAL_TABLET | Freq: Every day | ORAL | 0 refills | Status: DC

## 2019-01-17 NOTE — Telephone Encounter (Signed)
Pt has been scheduled for a virtual visit  

## 2019-01-17 NOTE — Telephone Encounter (Signed)
See request °

## 2019-01-17 NOTE — Telephone Encounter (Signed)
Incoming call from Patient who complains of having Sx. Of thrush who would like to get a Rx for treatment.  Main complaint that his mouth feels dry all the time.        Reason for Disposition . White patches that stick to tongue or inner cheek  Answer Assessment - Initial Assessment Questions 1. SYMPTOM: "What's the main symptom you're concerned about?" (e.g., dry mouth. chapped lips, lump)     Thrush 2. ONSET: "When did the  *No Answer*  start?"     End of Feb  endosopy last month 3. PAIN: "Is there any pain?" If so, ask: "How bad is it?" (Scale: 1-10; mild, moderate, severe)     Discomfort dry , bad pain 4. CAUSE: "What do you think is causing the symptoms?"     *No Answer* 5. OTHER SYMPTOMS: "Do you have any other symptoms?" (e.g., fever, sore throat, toothache, swelling)     denies 6. PREGNANCY: "Is there any chance you are pregnant?" "When was your last menstrual period?"     na  Protocols used: MOUTH [facility]

## 2019-01-17 NOTE — Progress Notes (Signed)
Virtual Visit via Video Note  I connected with Brandon Buckley  on 01/17/19 at  1:00 PM EDT by Buckley video enabled telemedicine application and verified that I am speaking with the correct person using two identifiers.  Location patient: home Location provider:work or home office Persons participating in the virtual visit: patient, provider  I discussed the limitations of evaluation and management by telemedicine and the availability of in person appointments. The patient expressed understanding and agreed to proceed.   HPI:  Acute visit for "Thrush": -symptoms started several years ago, more so the last few months after taking an antibiotic -symptoms initially only occurred when he went out with friends and had Buckley few drinks - he would notice it the next day -he has Buckley sensation in the mouth - Buckley dry feeling, whiteness to the tongue and Buckley bad taste in his mouth -on pantoprazole for GERD/hiatal hernia - helped his heartburn, but not the mouth issues -saw GI and had endoscopy and dentist for this and was told it is thrush -dentist told him to talk to PCP about treatment -denies pain, burning, trouble swallowing, fevers, weight loss or any other symptoms -eats Buckley lot of sugar  ROS: See pertinent positives and negatives per HPI.  Past Medical History:  Diagnosis Date  . ALLERGIC RHINITIS 03/06/2009   Qualifier: Diagnosis of  By: Brandon Buckley, Brandon Buckley   . 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 GYNECOMASTIA 03/06/2009   Qualifier: Diagnosis of  By: Brandon Buckley, Brandon Buckley   . Vascular headache 10/11/2011    Past Surgical History:  Procedure Laterality Date  . WISDOM TOOTH EXTRACTION      Family History  Problem Relation Age of Onset  . Hypertension Other        both grandparents  . Diabetes type II Other        both grandparents.   . Stroke Other        ggm  . Migraines Mother     SOCIAL HX: see hpi   Current Outpatient Medications:  .  pantoprazole (PROTONIX) 40 MG tablet, Take 40 mg by mouth  daily., Disp: , Rfl:  .  triamcinolone cream (KENALOG) 0.1 %, Apply 1 application topically 2 (two) times daily. Not on face, Disp: 30 g, Rfl: 2 .  fluconazole (DIFLUCAN) 100 MG tablet, Take 1 tablet (100 mg total) by mouth daily., Disp: 7 tablet, Rfl: 0  Current Facility-Administered Medications:  .  0.9 %  sodium chloride infusion, 500 mL, Intravenous, Once, Brandon Buckley, Brandon Buckley, Buckley  EXAM:  VITALS per patient if applicable:  GENERAL: alert, oriented, appears well and in no acute distress  HEENT: atraumatic, conjunttiva clear, no obvious abnormalities on inspection of external nose and ears, moist oral mucosa with adequate saliva and no dryness or cracking of the lips or mouth on gross visual exam, mild whiteness to the tongue, otherwise normal inspection of the oropharynx  NECK: normal movements of the head and neck  LUNGS: on inspection no signs of respiratory distress, breathing rate appears normal, no obvious gross SOB, gasping or wheezing  CV: no obvious cyanosis  MS: moves all visible extremities without noticeable abnormality  PSYCH/NEURO: pleasant and cooperative, no obvious depression or anxiety, speech and thought processing grossly intact  ASSESSMENT AND PLAN:  Discussed the following assessment and plan:  Bad taste in mouth   -we discussed possible serious and likely etiologies, workup and treatment, treatment risks and return precautions; possible mild thrush vs other. -after this discussion, Brandon Buckley  opted for trial treatment for thrush.  Advised healthy low sugar diet, gentle brushing of the tongue twice daily. Also sent diflucan 100mg  daily x7 days. -Agrees to follow up with PCP if symptoms persist, worsen or new concerns arise.     Brandon Kern, DO

## 2019-01-21 ENCOUNTER — Ambulatory Visit: Payer: Self-pay | Admitting: *Deleted

## 2019-01-21 NOTE — Telephone Encounter (Signed)
Message from [place] sent at 01/21/2019 1:33 PM EDT  Summary: bad taste in mouth    Patient called with complaints of dryness of tongue and a bad taste in mouth. Patient is requesting a call back from NT with advice.

## 2019-01-21 NOTE — Telephone Encounter (Signed)
Called patient, no answer, voicemail box full- unable to leave message.  Home number not in service.

## 2019-01-21 NOTE — Telephone Encounter (Signed)
  Reason for Disposition . White patches that stick to tongue or inner cheek    Also complains of dry mouth.  Answer Assessment - Initial Assessment Questions 1. SYMPTOM: "What's the main symptom you're concerned about?" (e.g., dry mouth. chapped lips, lump)     Dry mouth, white tongue, feels like film or something coating tongue  2. ONSET: "When did the  start?"     March 2020 - intermittent  3. PAIN: "Is there any pain?" If so, ask: "How bad is it?" (Scale: 1-10; mild, moderate, severe)     No pain 4. CAUSE: "What do you think is causing the symptoms?"  Not sure, smokes marajuana      5. OTHER SYMPTOMS: "Do you have any other symptoms?" (e.g., fever, sore throat, toothache, swelling)     Sore throat last evening - left side for 1-2 hours  6. PREGNANCY: "Is there any chance you are pregnant?" "When was your last menstrual period?"     N/A  Protocols used: MOUTH [facility]   Patient had telemedicine visit 01/17/2019 for white patches on tongue, bad taste, and dry mouth, and was started on fluconazole.  He has had 5 doses of the medication, and states his symptoms have not improved at all, and may have even worsened.  States his whole tongue is white, he has dry mouth with a bad taste.  He denies any pain, swelling, or toothache.  States he did have a sore throat yesterday for 1-2 hours.  He states the symptoms originally started in the beginning of March 2020, and have been intermittent.  Contacted patient's PCP office and transferred patient's call to scheduler to schedule follow up visit regarding his mouth symptoms.

## 2019-01-23 ENCOUNTER — Telehealth: Payer: Self-pay | Admitting: Internal Medicine

## 2019-01-23 NOTE — Telephone Encounter (Signed)
Pt needs to wait for appt pt has appt Friday

## 2019-01-23 NOTE — Telephone Encounter (Signed)
Medication: fluconazole (DIFLUCAN) 100 MG tablet     Patient states during virtual visit with Dr. Maudie Mercury, he was advised 14 tablets of this medication would be sent in. Patient states he has only received 7. He inquired if the remainder could be sent into pharmacy.    Pharmacy:  [facility] [place], [place] - [place] [phone] (Phone) [phone] (Fax)

## 2019-01-24 NOTE — Telephone Encounter (Signed)
Pt called and stated that he did not have medication for today. Please advise   [phone]

## 2019-01-24 NOTE — Progress Notes (Signed)
Chief Complaint  Patient presents with  . Follow-up   Virtual Visit via Video Note  I connected with@ on 01/25/19 at  2:00 PM EDT by a video enabled telemedicine application and verified that I am speaking with the correct person using two identifiers. Location patient: home Location provider:work or home office Persons participating in the virtual visit: patient, provider  WIth national recommendations  regarding COVID 19 pandemic   video visit is advised over in office visit for this patient.  Patient aware  of the limitations of evaluation and management by telemedicine and  availability of in person appointments. and agreed to proceed.   HPI: Brandon Buckley 23 y.o. come in for ongoing concerns about  His tongue and mouth Having been seen for various sx realted to mouth and  Poss gerd  And bad taste  Had endo has small HH and  Those sx seem to be better    On pp[i  Had vv after antibiotic rx with diflucan  Last week  And  Finishing up. Not sure if any better  Has cut out a lot of dietary  Components   Has been a dail;y week user reg rolled unitl alst week when trying off   No vaping.  Using whitener tooth paste  And more natural mouth wash . Feels mouth is dry but doesn't feel dry to the touch .  Feels like a coating but  Not really there an doesn look like reg thrush  Last dental appt  Jan ans all ok  No pain or loss of taste   No glossitis   No nasal congestion  ROS: See pertinent positives and negatives per HPI.  Past Medical History:  Diagnosis Date  . ALLERGIC RHINITIS 03/06/2009   Qualifier: Diagnosis of  By: Clent RidgesFry MD, Tera MaterStephen A   . 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 GYNECOMASTIA 03/06/2009   Qualifier: Diagnosis of  By: Clent RidgesFry MD, Tera MaterStephen A   . Vascular headache 10/11/2011    Past Surgical History:  Procedure Laterality Date  . WISDOM TOOTH EXTRACTION      Family History  Problem Relation Age of Onset  . Hypertension Other        both grandparents  . Diabetes  type II Other        both grandparents.   . Stroke Other        ggm  . Migraines Mother     Social History   Tobacco Use  . Smoking status: Never Smoker  . Smokeless tobacco: Never Used  Substance Use Topics  . Alcohol use: No  . Drug use: Not on file      Current Outpatient Medications:  .  fluconazole (DIFLUCAN) 100 MG tablet, Take 1 tablet (100 mg total) by mouth daily., Disp: 7 tablet, Rfl: 0 .  pantoprazole (PROTONIX) 40 MG tablet, Take 40 mg by mouth daily., Disp: , Rfl:  .  triamcinolone cream (KENALOG) 0.1 %, Apply 1 application topically 2 (two) times daily. Not on face, Disp: 30 g, Rfl: 2  Current Facility-Administered Medications:  .  0.9 %  sodium chloride infusion, 500 mL, Intravenous, Once, Meryl DareStark, Malcolm T, MD  EXAM: BP Readings from Last 3 Encounters:  11/09/18 (!) 143/87  03/06/18 (!) 140/100  01/31/18 133/87    VITALS per patient if applicable: GENERAL: alert, oriented, appears well and in no acute distress HEENT: atraumatic, conjunttiva clear, no obvious abnormalities on inspection of external nose and earsNECK: normal movements of the head  and neck LUNGS: on inspection no signs of respiratory distress, breathing rate appears normal, no obvious gross SOB, gasping or wheezing CV: no obvious cyanosis MS: moves all visible extremities without noticeable abnormality  PSYCH/NEURO: pleasant and cooperative, no obvious depression or anxiety, speech and thought processing grossly intact Lab Results  Component Value Date   WBC 6.0 07/15/2014   HGB 14.8 07/15/2014   HCT 42.9 07/15/2014   PLT 274.0 07/15/2014   GLUCOSE 89 07/15/2014   CHOL 144 07/15/2014   TRIG 58.0 07/15/2014   HDL 65.40 07/15/2014   LDLCALC 67 07/15/2014   ALT 8 07/15/2014   AST 15 07/15/2014   NA 137 07/15/2014   K 4.2 07/15/2014   CL 103 07/15/2014   CREATININE 0.80 07/15/2014   BUN 11 07/15/2014   CO2 29 07/15/2014   TSH 0.88 07/15/2014    ASSESSMENT AND PLAN:  Discussed  the following assessment and plan:    ICD-10-CM   1. Dry tongue feeling   K14.8   2. Tongue symptom  R19.8   3. Bad taste in mouth  R43.8    At t his time  No alarm sx but patient is concerned about situation  Agree with stopping  Weed products and other irritants   Consideration of a dry mouth syndrome   Advise see his dentist first  Can consider seeing ent next if needed  Doesn't sound like need to retreat for thrush  He has been battling    Situation  For months  Counseled.   Expectant management and discussion of plan and treatment with opportunity to ask questions and all were answered. The patient agreed with the plan and demonstrated an understanding of the instructions.   Advised to call back or seek an in-person evaluation if worsening  or having  further concerns .  I provided 25 minutes of non-face-to-face time during this encounter.   Shanon Ace, MD

## 2019-01-25 ENCOUNTER — Telehealth: Payer: BC Managed Care – PPO | Admitting: Internal Medicine

## 2019-01-25 ENCOUNTER — Other Ambulatory Visit: Payer: Self-pay

## 2019-01-25 ENCOUNTER — Ambulatory Visit (INDEPENDENT_AMBULATORY_CARE_PROVIDER_SITE_OTHER): Payer: BC Managed Care – PPO | Admitting: Internal Medicine

## 2019-01-25 ENCOUNTER — Encounter: Payer: Self-pay | Admitting: Internal Medicine

## 2019-01-25 DIAGNOSIS — K148 Other diseases of tongue: Secondary | ICD-10-CM | POA: Diagnosis not present

## 2019-01-25 DIAGNOSIS — R198 Other specified symptoms and signs involving the digestive system and abdomen: Secondary | ICD-10-CM

## 2019-01-25 DIAGNOSIS — R438 Other disturbances of smell and taste: Secondary | ICD-10-CM | POA: Diagnosis not present

## 2019-01-25 NOTE — Telephone Encounter (Signed)
Pt has appt with Dr.Panosh today can be discussed then

## 2019-01-30 IMAGING — US US SCROTUM
1 series · 14 of 25 positions shown · non-contrast
Comparison: None.

CLINICAL DATA: Right testicular pain for 3 months.

EXAM:
SCROTAL ULTRASOUND
DOPPLER ULTRASOUND OF THE TESTICLES
TECHNIQUE: Complete ultrasound examination of the testicles, epididymis, and
other scrotal structures was performed. Color and spectral Doppler
ultrasound were also utilized to evaluate blood flow to the
testicles.

[Series 1: us scrotum · 0.05mm/px · 14 of 52 slices shown]
[im 1/52]
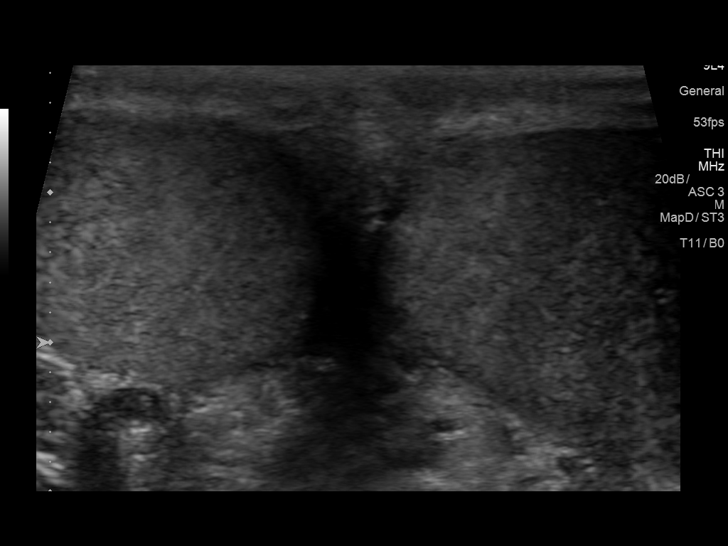
[im 5/52]
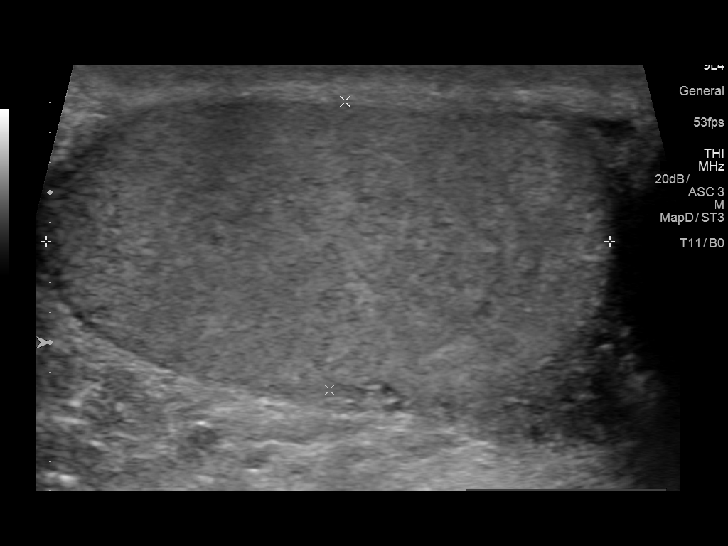
[im 9/52]
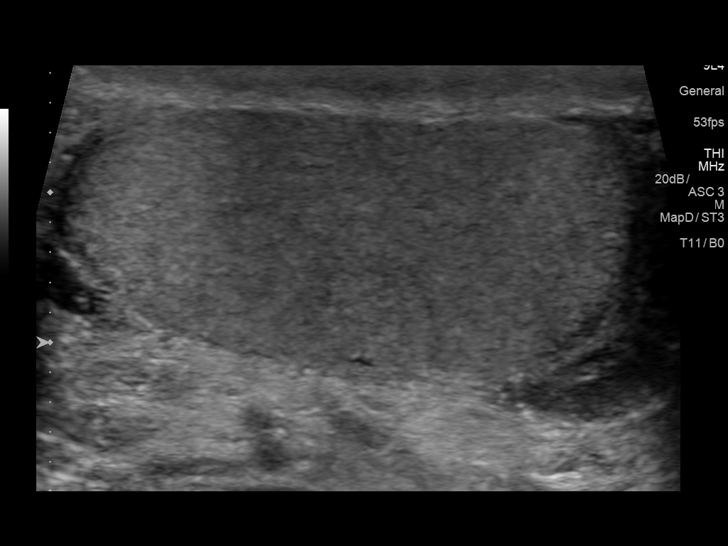
[im 13/52]
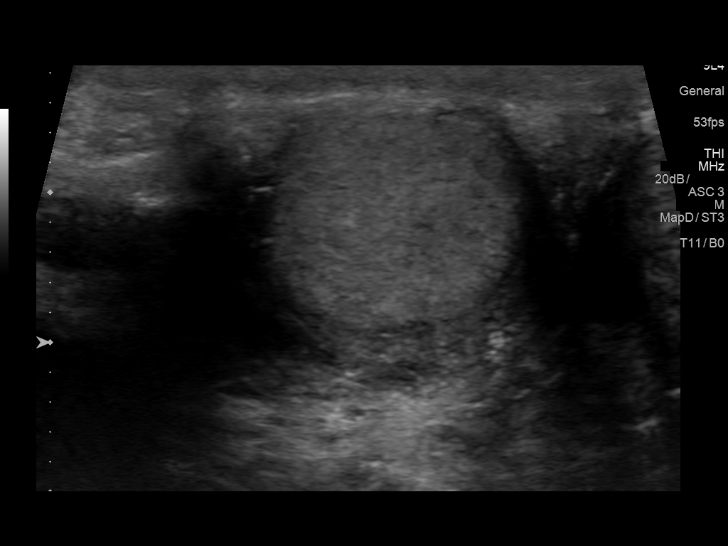
[im 18/52]
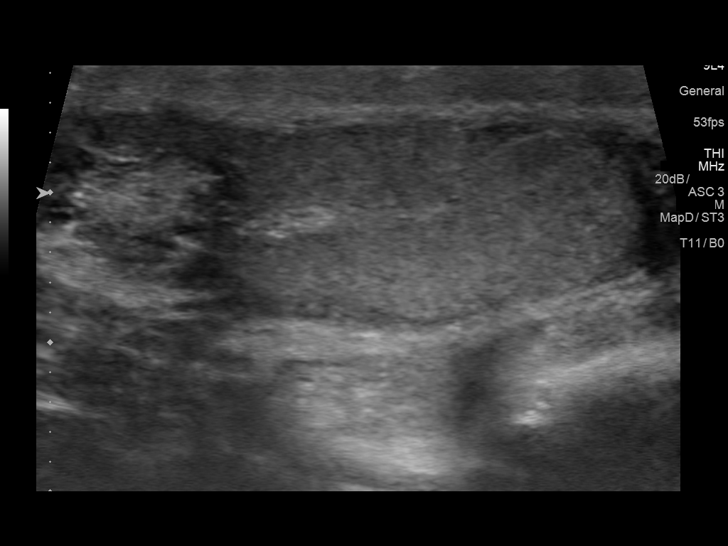
[im 20/52]
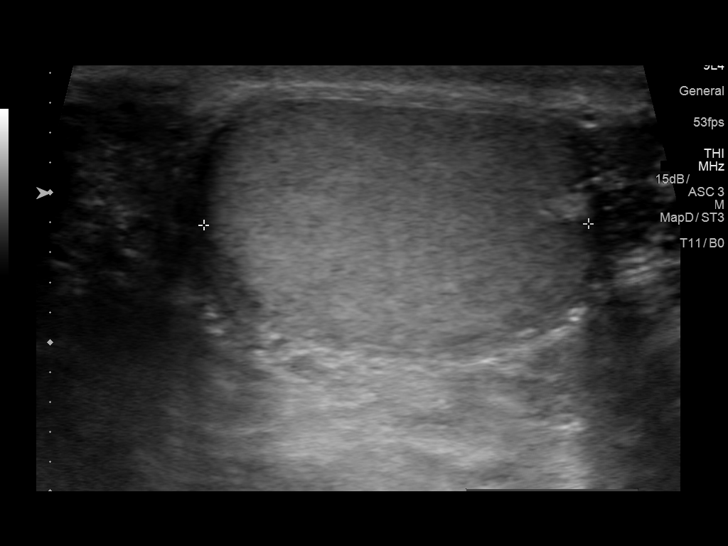
[im 24/52]
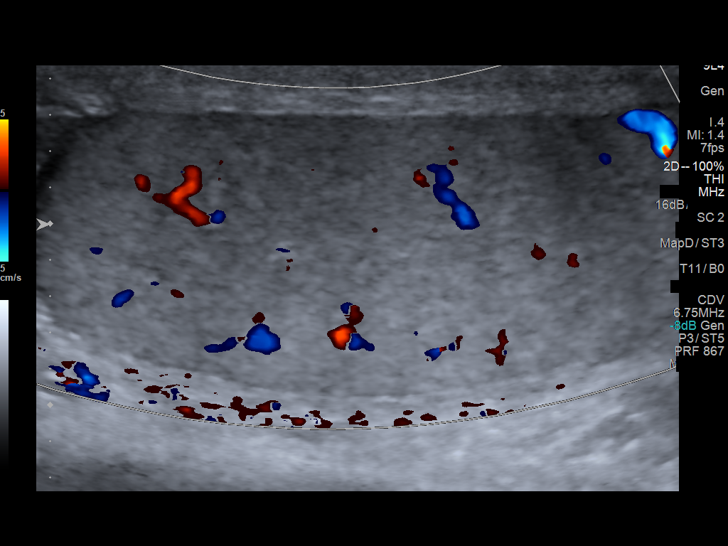
[im 28/52]
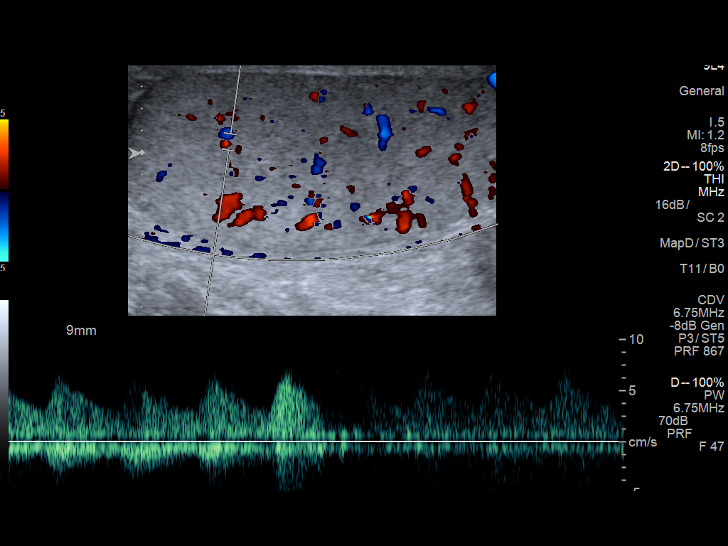
[im 32/52]
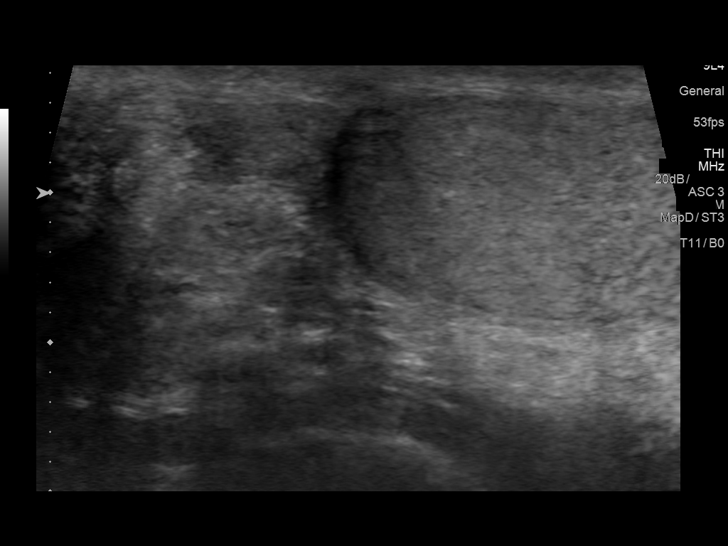
[im 35/52]
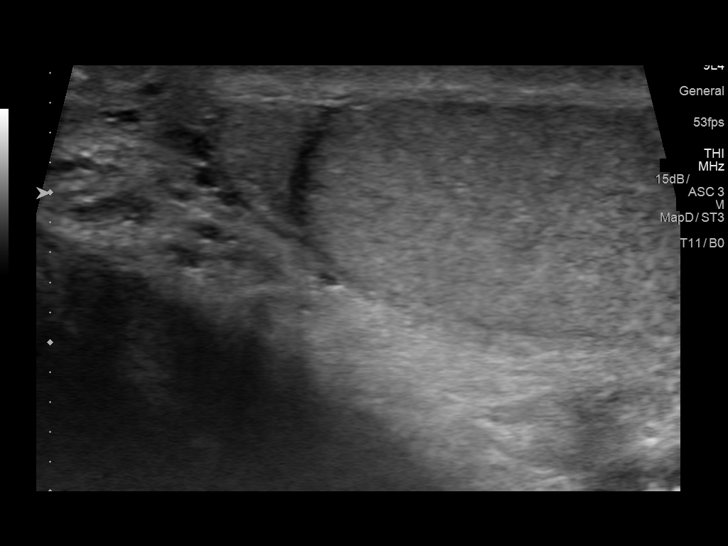
[im 39/52]
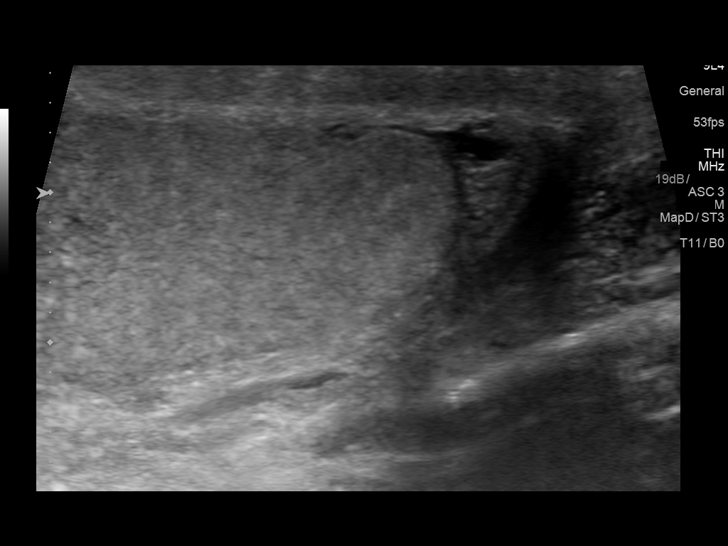
[im 43/52]
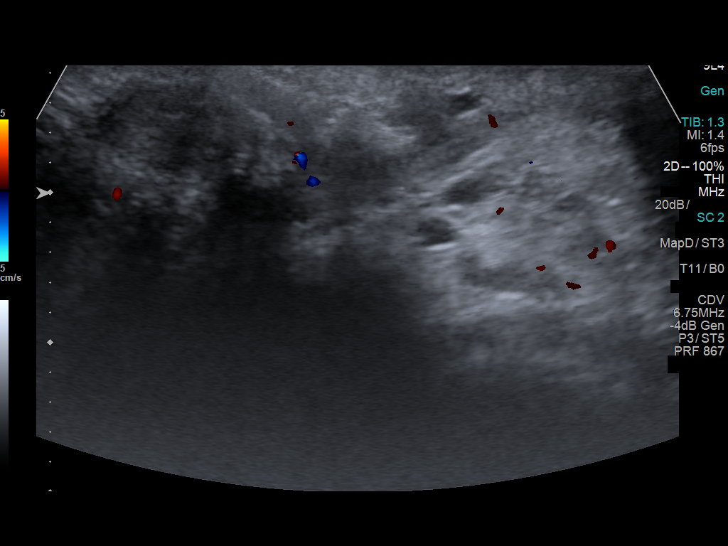
[im 47/52]
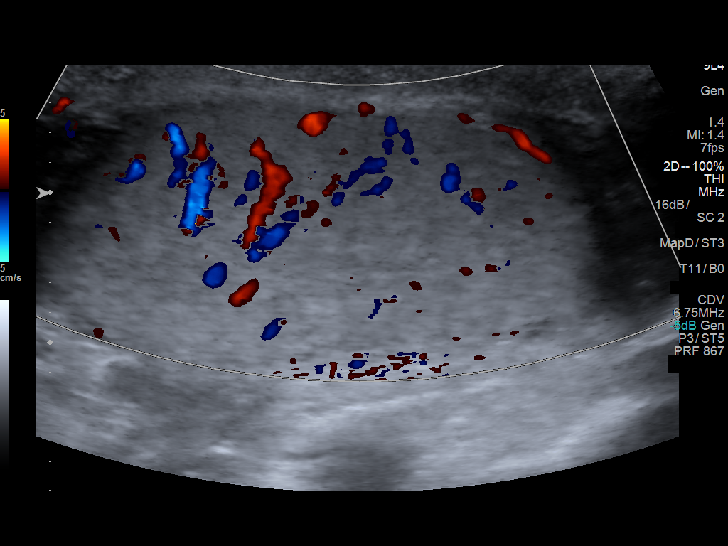
[im 52/52]
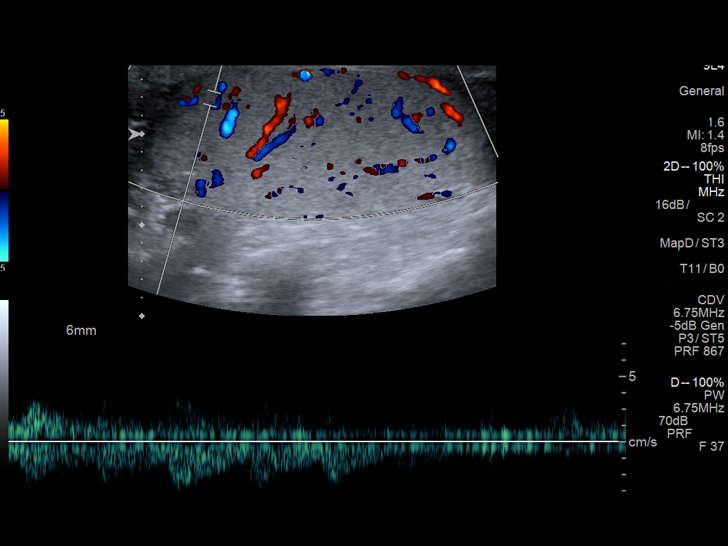

[14 of 25 positions shown; findings below may reference images not displayed]

FINDINGS: Right testicle

Measurements: 3.8 x 1.9 x 2.4 cm. No mass or microlithiasis
visualized.

Left testicle

Measurements: 4 x 1.6 x 2.6 cm. No mass or microlithiasis
visualized.

Right epididymis:  Normal in size and appearance.

Left epididymis:  Normal in size and appearance.

Hydrocele:  None visualized.

Varicocele:  None visualized.

Pulsed Doppler interrogation of both testes demonstrates normal low
resistance arterial and venous waveforms bilaterally.
IMPRESSION: Normal study.  No cause for testicular pain noted.

## 2019-01-31 DIAGNOSIS — R030 Elevated blood-pressure reading, without diagnosis of hypertension: Secondary | ICD-10-CM | POA: Diagnosis not present

## 2019-01-31 DIAGNOSIS — R5383 Other fatigue: Secondary | ICD-10-CM | POA: Diagnosis not present

## 2019-01-31 DIAGNOSIS — R682 Dry mouth, unspecified: Secondary | ICD-10-CM | POA: Diagnosis not present

## 2019-02-01 ENCOUNTER — Ambulatory Visit: Payer: Self-pay | Admitting: *Deleted

## 2019-02-01 NOTE — Progress Notes (Signed)
Chief Complaint  Patient presents with  . Hypertension    Patient came into the office c/o high Bp. Pt went to the UC nervous that he may have diabetes. Pt stated his Bp was 160/100.     HPI: Brandon Buckley 23 y.o. come in for blood pressure elevation  Readings noted at  ? UC  May have been anxious and parents  Have HT   Concern  Did see dentist  And told ok and given  peridex  .  Some temporary se  Burning in frot of tongue but no changes  Seems to have sx after talking a lot     Has small HH  Med helps if takes but  Comes back if off med  Wants to make sure all ok  Gets ocass cp  ? Gi and if rolls in bed  Some exercise but hurt side of left knee  No other sx   Asks for refill tmc  For dry skin ROS: See pertinent positives and negatives per HPI.  Past Medical History:  Diagnosis Date  . ALLERGIC RHINITIS 03/06/2009   Qualifier: Diagnosis of  By: Clent RidgesFry MD, Tera MaterStephen A   . 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 GYNECOMASTIA 03/06/2009   Qualifier: Diagnosis of  By: Clent RidgesFry MD, Tera MaterStephen A   . Vascular headache 10/11/2011    Family History  Problem Relation Age of Onset  . Hypertension Other        both grandparents  . Diabetes type II Other        both grandparents.   . Stroke Other        ggm  . Migraines Mother     Social History   Socioeconomic History  . Marital status: Single    Spouse name: Not on file  . Number of children: Not on file  . Years of education: Not on file  . Highest education level: Not on file  Occupational History  . Not on file  Social Needs  . Financial resource strain: Not on file  . Food insecurity    Worry: Not on file    Inability: Not on file  . Transportation needs    Medical: Not on file    Non-medical: Not on file  Tobacco Use  . Smoking status: Never Smoker  . Smokeless tobacco: Never Used  Substance and Sexual Activity  . Alcohol use: No  . Drug use: Not on file  . Sexual activity: Not on file  Lifestyle  . Physical activity    Days per week: Not on file    Minutes per session: Not on file  . Stress: Not on file  Relationships  . Social Musicianconnections    Talks on phone: Not on file    Gets together: Not on file    Attends religious service: Not on file    Active member of club or organization: Not on file    Attends meetings of clubs or organizations: Not on file    Relationship status: Not on file  Other Topics Concern  . Not on file  Social History Narrative   hh of 5    3 dogs    No ets.      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 Meloxicam 10 MG CAPS  Take 40 mg by mouth.    . fluconazole (DIFLUCAN) 100 MG tablet Take 1 tablet (100 mg total) by mouth daily. 7 tablet 0  . pantoprazole (PROTONIX) 40 MG tablet Take 40 mg by mouth daily.    Marland Kitchen triamcinolone cream (KENALOG) 0.1 % Apply 1 application topically 2 (two) times daily. Not on face 30 g 2   Facility-Administered Medications Prior to Visit  Medication Dose Route Frequency Provider Last Rate Last Dose  . 0.9 %  sodium chloride infusion  500 mL Intravenous Once Meryl Dare, MD         EXAM:  BP 128/80   Pulse 69   Temp 98.5 F (36.9 C) (Oral)   Ht 5' 10.5" (1.791 m)   Wt 149 lb 12.8 oz (67.9 kg)   SpO2 96%   BMI 21.19 kg/m   Body mass index is 21.19 kg/m. Repeat bp 124/78 right  122/78 left  GENERAL: vitals reviewed and listed above, alert, oriented, appears well hydrated and in no acute distress HEENT: atraumatic, conjunctiva  clear, no obvious abnormalities on inspection of external nose and earstm clear OP : no lesion edema or exudate tongue looks normal  NECK: no obvious masses on inspection palpation  LUNGS: clear to auscultation bilaterally, no wheezes, rales or rhonchi, good air movement CV: HRRR, no clubbing cyanosis or  peripheral edema nl cap refill  Abdomen:  Sof,t normal bowel  sounds without hepatosplenomegaly, no guarding rebound or masses no CVA tenderness MS: moves all extremities without noticeable focal  abnormality PSYCH: pleasant and cooperative, no obvious depression  Lab Results  Component Value Date   WBC 4.5 02/04/2019   HGB 14.1 02/04/2019   HCT 41.2 02/04/2019   PLT 240.0 02/04/2019   GLUCOSE 84 02/04/2019   CHOL 157 02/04/2019   TRIG 39.0 02/04/2019   HDL 45.60 02/04/2019   LDLCALC 104 (H) 02/04/2019   ALT 7 02/04/2019   AST 11 02/04/2019   NA 142 02/04/2019   K 3.6 02/04/2019   CL 104 02/04/2019   CREATININE 0.81 02/04/2019   BUN 10 02/04/2019   CO2 30 02/04/2019   TSH 2.43 02/04/2019   HGBA1C 5.5 02/04/2019   BP Readings from Last 3 Encounters:  02/04/19 128/80  11/09/18 (!) 143/87  03/06/18 (!) 140/100   Wt Readings from Last 3 Encounters:  02/04/19 149 lb 12.8 oz (67.9 kg)  11/09/18 150 lb (68 kg)  10/15/18 150 lb (68 kg)     ASSESSMENT AND PLAN:  Discussed the following assessment and plan:  Elevated blood pressure reading - Plan: Basic metabolic panel, CBC with Differential/Platelet, Hepatic function panel, Lipid panel, TSH, T4, free, Hemoglobin A1c, Vitamin B12, RPR, HIV Antibody (routine testing w rflx), Hepatitis C antibody, Sedimentation rate, C-reactive protein  Tongue symptom - Plan: Basic metabolic panel, CBC with Differential/Platelet, Hepatic function panel, Lipid panel, TSH, T4, free, Hemoglobin A1c, Vitamin B12, RPR, HIV Antibody (routine testing w rflx), Hepatitis C antibody, Sedimentation rate, C-reactive protein  Screening for HIV (human immunodeficiency virus)  Routine screening for STI (sexually transmitted infection) - Plan: RPR, HIV Antibody (routine testing w rflx), Hepatitis C antibody  Need for hepatitis C screening test - Plan: Hepatitis C antibody  Gastroesophageal reflux disease without esophagitis - Plan: Basic metabolic panel, CBC with Differential/Platelet, Hepatic function panel, Lipid  panel, TSH, T4, free, Hemoglobin A1c, Vitamin B12, RPR, HIV Antibody (routine testing w rflx), Hepatitis C antibody, Sedimentation rate, C-reactive protein  Medication management - Plan: Basic metabolic panel, CBC  with Differential/Platelet, Hepatic function panel, Lipid panel, TSH, T4, free, Hemoglobin A1c, Vitamin B12, Sedimentation rate, C-reactive protein Plan lab and fu  Depending on   Results bp and feel that  Tongue sx sound like dry mouth   Physical exam reassuring today  And  bp readings -Patient advised to return or notify health care team  if  new concerns arise.  Patient Instructions   Health Maintenance Due  Topic Date Due  . INFLUENZA VACCINE  01/05/2019    Depression screen PHQ 2/9 01/30/2017  Decreased Interest 0  Down, Depressed, Hopeless 0  PHQ - 2 Score 0    Lab today screening   You bp is good today  And may have been up from anxiety but certainly  Family history may make you predisposed  To elevation s.   T this time   Healthy eating  And acitivity  Smaller meals go better  To control reflux .     Will let  You know when results are back  \ Your exam is good today .  Can check Bp readings at home if it doesn't make you nervous   ( that will raise BP readings)   Standley Brooking. Kaity Pitstick M.D.

## 2019-02-01 NOTE — Telephone Encounter (Signed)
   Reason for Disposition . Systolic BP  >= 782 OR Diastolic >= 423  Answer Assessment - Initial Assessment Questions 1. BLOOD PRESSURE: "What is the blood pressure?" "Did you take at least two measurements 5 minutes apart?"     164/111 at urgent care yesterday, states they took a second reading, but he does not remember it   2. ONSET: "When did you take your blood pressure?"     Yesterday at [facility] urgent care 3. HOW: "How did you obtain the blood pressure?" (e.g., visiting nurse, automatic home BP monitor) Automatic BP monitor at doctor office      4. HISTORY: "Do you have a history of high blood pressure?"     Gradually raising over the past year per patient, has never been this elevated  5. MEDICATIONS: "Are you taking any medications for blood pressure?" "Have you missed any doses recently?"     Not on BP medication 6. OTHER SYMPTOMS: "Do you have any symptoms?" (e.g., headache, chest pain, blurred vision, difficulty breathing, weakness)     Denies all of the above  7. PREGNANCY: "Is there any chance you are pregnant?" "When was your last menstrual period?"     N/A  Protocols used: HIGH BLOOD PRESSURE-A-AH  Patient states he went to [facility] urgent care yesterday because he was concerned about having dry mouth, and he wanted to have his blood sugar checked.  States he was told his blood sugar was low at 64, but blood pressure was elevated - 164/111.  He states they checked BP a second time, but he does not recall the reading.  He states his blood pressure has been gradually rising over the past year or so, but has never been this high.  He denies any chest pain, shortness of breath, blurred vision, headache.  States he will occasionally have a twinge in his chest that usually last only a second.  This is not related to exertion, he has the sensation every few weeks, and it is not prolonged, goes away almost instantaneously.  Advised patient that he should be seen within the next 3 days  given his elevated BP yesterday.  He has been scheduled to see Dr. Regis Bill Monday 02/04/2019.  Advised patient that should he develop any chest pain, shortness of breath, blurred vision, or severe headache before his appointment to seek treatment at ER.  Patient expressed understanding and is agreeable with plan.

## 2019-02-04 ENCOUNTER — Ambulatory Visit (INDEPENDENT_AMBULATORY_CARE_PROVIDER_SITE_OTHER): Payer: BC Managed Care – PPO | Admitting: Internal Medicine

## 2019-02-04 ENCOUNTER — Other Ambulatory Visit: Payer: Self-pay

## 2019-02-04 ENCOUNTER — Encounter: Payer: Self-pay | Admitting: Internal Medicine

## 2019-02-04 VITALS — BP 128/80 | HR 69 | Temp 98.5°F | Ht 70.5 in | Wt 149.8 lb

## 2019-02-04 DIAGNOSIS — R030 Elevated blood-pressure reading, without diagnosis of hypertension: Secondary | ICD-10-CM | POA: Diagnosis not present

## 2019-02-04 DIAGNOSIS — Z79899 Other long term (current) drug therapy: Secondary | ICD-10-CM

## 2019-02-04 DIAGNOSIS — K219 Gastro-esophageal reflux disease without esophagitis: Secondary | ICD-10-CM | POA: Diagnosis not present

## 2019-02-04 DIAGNOSIS — Z113 Encounter for screening for infections with a predominantly sexual mode of transmission: Secondary | ICD-10-CM

## 2019-02-04 DIAGNOSIS — Z1159 Encounter for screening for other viral diseases: Secondary | ICD-10-CM | POA: Diagnosis not present

## 2019-02-04 DIAGNOSIS — Z114 Encounter for screening for human immunodeficiency virus [HIV]: Secondary | ICD-10-CM | POA: Diagnosis not present

## 2019-02-04 DIAGNOSIS — R198 Other specified symptoms and signs involving the digestive system and abdomen: Secondary | ICD-10-CM

## 2019-02-04 LAB — LIPID PANEL
Cholesterol: 157 mg/dL (ref 0–200)
HDL: 45.6 mg/dL (ref >39.00)
LDL Cholesterol: 104 mg/dL — ABNORMAL HIGH (ref 0–99)
NonHDL: 111.82
Total CHOL/HDL Ratio: 3
Triglycerides: 39 mg/dL (ref 0.0–149.0)
VLDL: 7.8 mg/dL (ref 0.0–40.0)

## 2019-02-04 LAB — BASIC METABOLIC PANEL
BUN: 10 mg/dL (ref 6–23)
CO2: 30 mEq/L (ref 19–32)
Calcium: 9.5 mg/dL (ref 8.4–10.5)
Chloride: 104 mEq/L (ref 96–112)
Creatinine, Ser: 0.81 mg/dL (ref 0.40–1.50)
GFR: 140.09 mL/min (ref >60.00)
Glucose, Bld: 84 mg/dL (ref 70–99)
Potassium: 3.6 mEq/L (ref 3.5–5.1)
Sodium: 142 mEq/L (ref 135–145)

## 2019-02-04 LAB — HEPATIC FUNCTION PANEL
ALT: 7 U/L (ref 0–53)
AST: 11 U/L (ref 0–37)
Albumin: 4.4 g/dL (ref 3.5–5.2)
Alkaline Phosphatase: 70 U/L (ref 39–117)
Bilirubin, Direct: 0.2 mg/dL (ref 0.0–0.3)
Total Bilirubin: 0.7 mg/dL (ref 0.2–1.2)
Total Protein: 6.8 g/dL (ref 6.0–8.3)

## 2019-02-04 LAB — CBC WITH DIFFERENTIAL/PLATELET
Basophils Absolute: 0 10*3/uL (ref 0.0–0.1)
Basophils Relative: 0.9 % (ref 0.0–3.0)
Eosinophils Absolute: 0.1 10*3/uL (ref 0.0–0.7)
Eosinophils Relative: 1.9 % (ref 0.0–5.0)
HCT: 41.2 % (ref 39.0–52.0)
Hemoglobin: 14.1 g/dL (ref 13.0–17.0)
Lymphocytes Relative: 46.9 % — ABNORMAL HIGH (ref 12.0–46.0)
Lymphs Abs: 2.1 10*3/uL (ref 0.7–4.0)
MCHC: 34.2 g/dL (ref 30.0–36.0)
MCV: 89.4 fl (ref 78.0–100.0)
Monocytes Absolute: 0.4 10*3/uL (ref 0.1–1.0)
Monocytes Relative: 8 % (ref 3.0–12.0)
Neutro Abs: 1.9 10*3/uL (ref 1.4–7.7)
Neutrophils Relative %: 42.3 % — ABNORMAL LOW (ref 43.0–77.0)
Platelets: 240 10*3/uL (ref 150.0–400.0)
RBC: 4.61 Mil/uL (ref 4.22–5.81)
RDW: 12.9 % (ref 11.5–15.5)
WBC: 4.5 10*3/uL (ref 4.0–10.5)

## 2019-02-04 LAB — T4, FREE: Free T4: 1.05 ng/dL (ref 0.60–1.60)

## 2019-02-04 LAB — TSH: TSH: 2.43 u[IU]/mL (ref 0.35–4.50)

## 2019-02-04 LAB — C-REACTIVE PROTEIN: CRP: <1 mg/dL (ref 0.5–20.0)

## 2019-02-04 LAB — SEDIMENTATION RATE: Sed Rate: 2 mm/hr (ref 0–15)

## 2019-02-04 LAB — VITAMIN B12: Vitamin B-12: 573 pg/mL (ref 211–911)

## 2019-02-04 LAB — HEMOGLOBIN A1C: Hgb A1c MFr Bld: 5.5 % (ref 4.6–6.5)

## 2019-02-04 MED ORDER — TRIAMCINOLONE ACETONIDE 0.1 % EX CREA: 1.0000 "application " | TOPICAL_CREAM | Freq: Two times a day (BID) | CUTANEOUS | 2 refills | Status: DC

## 2019-02-04 NOTE — Patient Instructions (Addendum)
Health Maintenance Due  Topic Date Due  . INFLUENZA VACCINE  01/05/2019    Depression screen PHQ 2/9 01/30/2017  Decreased Interest 0  Down, Depressed, Hopeless 0  PHQ - 2 Score 0    Lab today screening   You bp is good today  And may have been up from anxiety but certainly  Family history may make you predisposed  To elevation s.   T this time   Healthy eating  And acitivity  Smaller meals go better  To control reflux .     Will let  You know when results are back  \ Your exam is good today .  Can check Bp readings at home if it doesn't make you nervous   ( that will raise BP readings)

## 2019-02-05 ENCOUNTER — Telehealth: Payer: Self-pay

## 2019-02-05 NOTE — Telephone Encounter (Signed)
Seen in office  Labs done

## 2019-02-05 NOTE — Telephone Encounter (Signed)
Copied from [place] [id number]. Topic: General - Other >> Jan 31, 2019  5:04 PM Yvette Rack wrote: Reason for CRM: Pt request an order for labs to have blood work. Pt requests call back once order is placed.

## 2019-02-05 NOTE — Telephone Encounter (Signed)
Copied from [place] [id number]. Topic: General - Other >> Feb 05, 2019  3:33 PM Pauline Good wrote: Reason for CRM: pt need call back from nurse due to some questions from his visit, please call pt

## 2019-02-06 LAB — HIV ANTIBODY (ROUTINE TESTING W REFLEX): HIV 1&2 Ab, 4th Generation: NONREACTIVE

## 2019-02-06 LAB — HEPATITIS C ANTIBODY
Hepatitis C Ab: NONREACTIVE
SIGNAL TO CUT-OFF: 0.05 (ref <1.00)

## 2019-02-06 LAB — RPR: RPR Ser Ql: NONREACTIVE

## 2019-02-21 ENCOUNTER — Encounter: Payer: Self-pay | Admitting: Internal Medicine

## 2019-02-21 DIAGNOSIS — R438 Other disturbances of smell and taste: Secondary | ICD-10-CM

## 2019-02-21 DIAGNOSIS — R198 Other specified symptoms and signs involving the digestive system and abdomen: Secondary | ICD-10-CM

## 2019-02-21 NOTE — Telephone Encounter (Signed)
Sorry you are having such a tough time.   Let get opinion  From and ENT    If agrees [facility])  Dx bad taste   Tongue sx   ( dx placed ont the necounter)

## 2019-05-22 ENCOUNTER — Telehealth: Payer: Self-pay | Admitting: *Deleted

## 2019-05-22 NOTE — Telephone Encounter (Signed)
Pt would like to know if you still recommend ENT please advise.

## 2019-05-22 NOTE — Telephone Encounter (Signed)
Copied from [place] [phone]. Topic: General - Other >> May 22, 2019  2:44 PM Rainey Pines A wrote: Patient would like to speak with Dr. Regis Bill nurse in regards to if he should still see and ENT doctor for the bad taste in his mouth. Patient has tried all other recommendations from Dr.Panosh and no other specialist has been able to find the cause of the problem. Please advise

## 2019-05-25 DIAGNOSIS — Z20828 Contact with and (suspected) exposure to other viral communicable diseases: Secondary | ICD-10-CM | POA: Diagnosis not present

## 2019-05-25 NOTE — Telephone Encounter (Signed)
ENT evaluation would be the next step  (But  May not give definitive  answer. )  To if  Still bothering him  Then do referral again.  thanks .

## 2019-05-27 NOTE — Telephone Encounter (Signed)
Tried to call pt was unable to leave voicemail crm ceated okay to disclose

## 2019-05-28 DIAGNOSIS — J309 Allergic rhinitis, unspecified: Secondary | ICD-10-CM | POA: Diagnosis not present

## 2019-05-28 DIAGNOSIS — K219 Gastro-esophageal reflux disease without esophagitis: Secondary | ICD-10-CM | POA: Diagnosis not present

## 2019-09-04 DIAGNOSIS — R0989 Other specified symptoms and signs involving the circulatory and respiratory systems: Secondary | ICD-10-CM | POA: Diagnosis not present

## 2019-09-04 DIAGNOSIS — K219 Gastro-esophageal reflux disease without esophagitis: Secondary | ICD-10-CM | POA: Diagnosis not present

## 2019-09-04 DIAGNOSIS — R590 Localized enlarged lymph nodes: Secondary | ICD-10-CM | POA: Diagnosis not present

## 2019-09-04 DIAGNOSIS — F1290 Cannabis use, unspecified, uncomplicated: Secondary | ICD-10-CM | POA: Diagnosis not present

## 2019-09-05 ENCOUNTER — Other Ambulatory Visit: Payer: Self-pay | Admitting: Otolaryngology

## 2019-09-05 DIAGNOSIS — R590 Localized enlarged lymph nodes: Secondary | ICD-10-CM

## 2020-03-02 ENCOUNTER — Ambulatory Visit: Payer: BC Managed Care – PPO | Admitting: Gastroenterology

## 2020-04-06 ENCOUNTER — Other Ambulatory Visit: Payer: Self-pay | Admitting: Gastroenterology

## 2020-04-06 DIAGNOSIS — K219 Gastro-esophageal reflux disease without esophagitis: Secondary | ICD-10-CM

## 2020-04-09 ENCOUNTER — Telehealth: Payer: Self-pay | Admitting: Gastroenterology

## 2020-04-09 MED ORDER — PANTOPRAZOLE SODIUM 40 MG PO TBEC: 40.0000 mg | DELAYED_RELEASE_TABLET | Freq: Two times a day (BID) | ORAL | 1 refills | Status: DC

## 2020-04-09 NOTE — Telephone Encounter (Signed)
Informed patient he is over due for follow up visit and we can refill prescription until office visit. Patient scheduled for 05/27/20 at 2:30pm. Prescription sent to [facility] per patient request.

## 2020-04-09 NOTE — Telephone Encounter (Signed)
Patient requesting refills on Pantoprazole

## 2020-05-27 ENCOUNTER — Ambulatory Visit: Payer: Self-pay | Admitting: Gastroenterology

## 2020-07-06 ENCOUNTER — Ambulatory Visit (INDEPENDENT_AMBULATORY_CARE_PROVIDER_SITE_OTHER): Payer: PRIVATE HEALTH INSURANCE | Admitting: Gastroenterology

## 2020-07-06 ENCOUNTER — Encounter: Payer: Self-pay | Admitting: Gastroenterology

## 2020-07-06 VITALS — BP 128/70 | HR 80 | Ht 70.0 in | Wt 174.0 lb

## 2020-07-06 DIAGNOSIS — K219 Gastro-esophageal reflux disease without esophagitis: Secondary | ICD-10-CM | POA: Diagnosis not present

## 2020-07-06 DIAGNOSIS — R438 Other disturbances of smell and taste: Secondary | ICD-10-CM | POA: Diagnosis not present

## 2020-07-06 MED ORDER — PANTOPRAZOLE SODIUM 40 MG PO TBEC
40.0000 mg | DELAYED_RELEASE_TABLET | Freq: Two times a day (BID) | ORAL | 11 refills | Status: DC
Start: 2020-07-06 — End: 2021-08-10

## 2020-07-06 NOTE — Progress Notes (Signed)
    History of Present Illness: This is a 25 year old male previously evaluated in May and June 2020 for suspected GERD. EGD in 11/2018 was normal except for a small HH.  He has occasional mild heartburn symptoms when laying down after a meal but otherwise has no typical reflux symptoms including no typical LPR symptoms.  He generally takes pantoprazole once daily occasionally takes it twice daily.  He is not clear that this has had any impact on his main complaint which is a frequent bad taste in his mouth.  Evaluation by Langley Gauss, MD at [facility] in August 2021 did not find any sinonasal source to explain the bad taste in his mouth and she felt his symptoms were related to dry mouth.  Tonsillectomy was considered although no guarantees this will be curative.   Current Medications, Allergies, Past Medical History, Past Surgical History, Family History and Social History were reviewed in [employer] record.   Physical Exam: General: Well developed, well nourished, no acute distress Head: Normocephalic and atraumatic Eyes: Sclerae anicteric, EOMI Ears: Normal auditory acuity Mouth: Not examined, mask on during Covid-19 pandemic Lungs: Clear throughout to auscultation Heart: Regular rate and rhythm; no murmurs, rubs or bruits Abdomen: Soft, non tender and non distended. No masses, hepatosplenomegaly or hernias noted. Normal Bowel sounds Rectal: Not done Musculoskeletal: Symmetrical with no gross deformities  Pulses:  Normal pulses noted Extremities: No clubbing, cyanosis, edema or deformities noted Neurological: Alert oriented x 4, grossly nonfocal Psychological:  Alert and cooperative. Normal mood and affect   Assessment and Recommendations:  1. GERD.  Minimal reflux symptoms.  Continue pantoprazole 40 mg daily and can increase to 40 mg twice daily if he feels the higher dose is beneficial.  Follow antireflux measures.  2.  Bad taste in mouth felt secondary to dry  mouth.

## 2020-07-06 NOTE — Patient Instructions (Signed)
We have sent the following medications to your pharmacy for you to pick up at your convenience: pantoprazole 40 mg twice daily.   Thank you for choosing me and [facility].  Venita Lick. Pleas Koch., MD., Clementeen Graham

## 2020-07-21 ENCOUNTER — Ambulatory Visit: Payer: PRIVATE HEALTH INSURANCE | Admitting: Internal Medicine

## 2020-07-28 ENCOUNTER — Encounter: Payer: Self-pay | Admitting: Internal Medicine

## 2020-07-28 ENCOUNTER — Telehealth (INDEPENDENT_AMBULATORY_CARE_PROVIDER_SITE_OTHER): Payer: PRIVATE HEALTH INSURANCE | Admitting: Internal Medicine

## 2020-07-28 DIAGNOSIS — R438 Other disturbances of smell and taste: Secondary | ICD-10-CM | POA: Diagnosis not present

## 2020-07-28 NOTE — Progress Notes (Signed)
Virtual Visit via Video Note  I connected with@ on 07/28/20 at  3:30 PM EST by a video enabled telemedicine application and verified that I am speaking with the correct person using two identifiers. Location patient: home Location provider:work  office Persons participating in the virtual visit: patient, provider  [place] national recommendations  regarding COVID 19 pandemic   video visit is advised over in office visit for this patient.  Patient aware  of the limitations of evaluation and management by telemedicine and  availability of in person appointments. and agreed to proceed.   HPI:  Brandon Buckley presents for video visit touching base after evaluations in regard to the bad taste in his mouth that most recently has some good days but cannot predict them.  These days he does not have the symptoms.  But it never totally resolves. There are his taste is not distorted when he eats. He is seen GI had endoscopy has a small hiatal hernia has been on Protonix up to twice a day Has seen ENT in [facility] evaluated possible trial of dry mouth Flonase allergy sinus CT He has a history of tonsil stones and the question of a tonsillectomy came up but was not felt to be definitely a cure. He is understanding disappointed that this is not resolved. He never lost his taste and smell and no history of COVID stopped MJ for least 6 months and saw no difference.   ROS: See pertinent positives and negatives per HPI. Breeds dogs has 6 dogs Past Medical History:  Diagnosis Date  . ALLERGIC RHINITIS 03/06/2009   Qualifier: Diagnosis of  By: Clent Ridges MD, Tera Mater   . 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 GYNECOMASTIA 03/06/2009   Qualifier: Diagnosis of  By: Clent Ridges MD, Tera Mater   . Vascular headache 10/11/2011    Past Surgical History:  Procedure Laterality Date  . WISDOM TOOTH EXTRACTION      Family History  Problem Relation Age of Onset  . Hypertension Other        both grandparents  . Diabetes  type II Other        both grandparents.   . Stroke Other        ggm  . Migraines Mother   . Colon cancer Neg Hx   . Stomach cancer Neg Hx     Social History   Tobacco Use  . Smoking status: Never Smoker  . Smokeless tobacco: Never Used  Vaping Use  . Vaping Use: Never used  Substance Use Topics  . Alcohol use: No  . Drug use: Never      Current Outpatient Medications:  Marland Kitchen  Meloxicam 10 MG CAPS, Take 40 mg by mouth. (Patient not taking: Reported on 07/06/2020), Disp: , Rfl:  .  pantoprazole (PROTONIX) 40 MG tablet, Take 1 tablet (40 mg total) by mouth 2 (two) times daily before a meal., Disp: 60 tablet, Rfl: 11  EXAM: BP Readings from Last 3 Encounters:  07/06/20 128/70  02/04/19 128/80  11/09/18 (!) 143/87    VITALS per patient if applicable:  GENERAL: alert, oriented, appears well and in no acute distress  HEENT: atraumatic, conjunttiva clear, no obvious abnormalities on inspection of external nose and ears  NECK: normal movements of the head and neck  LUNGS: on inspection no signs of respiratory distress, breathing rate appears normal, no obvious gross SOB, gasping or wheezing  CV: no obvious cyanosis  MS: moves all visible extremities without noticeable abnormality  PSYCH/NEURO:  pleasant and cooperative, no obvious depression or anxiety, speech and thought processing grossly intact Lab Results  Component Value Date   WBC 4.5 02/04/2019   HGB 14.1 02/04/2019   HCT 41.2 02/04/2019   PLT 240.0 02/04/2019   GLUCOSE 84 02/04/2019   CHOL 157 02/04/2019   TRIG 39.0 02/04/2019   HDL 45.60 02/04/2019   LDLCALC 104 (H) 02/04/2019   ALT 7 02/04/2019   AST 11 02/04/2019   NA 142 02/04/2019   K 3.6 02/04/2019   CL 104 02/04/2019   CREATININE 0.81 02/04/2019   BUN 10 02/04/2019   CO2 30 02/04/2019   TSH 2.43 02/04/2019   HGBA1C 5.5 02/04/2019   Record review of specialty evaluation. ASSESSMENT AND PLAN:  Discussed the following assessment and plan:     ICD-10-CM   1. Bad taste in mouth  R43.8    UA shin by ENT and GI and consider allergy uncertain cause certainly intermittent sinus allergy could give a symptom but should not be this persistent.  I do not think it is a neurologic cause because he states that food tastes normal. I suggest perhaps going back to ENT and reevaluate for sinus and tonsil. Calendar good days and bad days minimally to bring that information. Fortunate no serious disease was seen but unfortunate that he still having recurrent symptoms but hopeful is having some spells where his symptoms are better  Counseled.   Expectant management and discussion of plan and treatment with opportunity to ask questions and all were answered. The patient agreed with the plan and demonstrated an understanding of the instructions.  Evaluation and review and discussion 30 minutes Advised to call back or seek an in-person evaluation if worsening  or having  further concerns . Keep [place] informed have specialist send [place] copies Follow-up depending No follow-ups on file.    Berniece Andreas, MD

## 2020-08-04 NOTE — Telephone Encounter (Signed)
So I am not sure if antibiotic will help although can help with sinuses mostly acute If not allergic please send in Augmentin [phone] 1 p.o. twice daily x7 days dispense 14 Please confirm what pharmacy and send in prescription   thank you

## 2020-08-13 ENCOUNTER — Other Ambulatory Visit: Payer: Self-pay

## 2020-08-13 MED ORDER — AMOXICILLIN-POT CLAVULANATE 875-125 MG PO TABS: 1.0000 | ORAL_TABLET | Freq: Two times a day (BID) | ORAL | 0 refills | Status: DC

## 2021-01-04 NOTE — Telephone Encounter (Signed)
Please have him get a COVID test (home COVID test is fine)  COVID can give a bad sore throat.  If negative ;  I advise visit and strep test rapid strep and culture backup

## 2021-05-20 ENCOUNTER — Telehealth: Payer: Self-pay

## 2021-05-20 NOTE — Telephone Encounter (Signed)
Patient father called asking if Dr. Fabian Sharp will take his son back as and patient because his son was away in college. Patient stated Dr. Fabian Sharp  is aware of his son's health condition's. Call back # [phone]

## 2021-05-23 NOTE — Telephone Encounter (Signed)
OK 

## 2021-08-06 ENCOUNTER — Other Ambulatory Visit: Payer: Self-pay | Admitting: Gastroenterology

## 2021-08-10 ENCOUNTER — Other Ambulatory Visit: Payer: Self-pay | Admitting: Gastroenterology

## 2021-08-10 ENCOUNTER — Telehealth: Payer: Self-pay | Admitting: Gastroenterology

## 2021-08-10 MED ORDER — PANTOPRAZOLE SODIUM 40 MG PO TBEC: 40.0000 mg | DELAYED_RELEASE_TABLET | Freq: Two times a day (BID) | ORAL | 0 refills | Status: DC

## 2021-08-10 NOTE — Telephone Encounter (Signed)
Patient called stating that he needs a refill for Protonix. Patient was scheduled for an OV for 3/30 at 3:00.  ?

## 2021-08-10 NOTE — Telephone Encounter (Signed)
Prescription sent to patient's pharmacy until scheduled appt. 

## 2021-08-11 ENCOUNTER — Encounter: Payer: Self-pay | Admitting: Gastroenterology

## 2021-09-01 ENCOUNTER — Telehealth: Payer: Self-pay | Admitting: Gastroenterology

## 2021-09-01 NOTE — Telephone Encounter (Signed)
Prefer in person evaluation so please reschedule. ?

## 2021-09-01 NOTE — Telephone Encounter (Signed)
Thank you Dr. Russella Dar. ?

## 2021-09-01 NOTE — Telephone Encounter (Signed)
Good Afternoon Dr. Russella Dar,  ? ?Patient called and stated that he has an appointment with you tomorrow at 3:00. Patient stated that he will not get off until 2:30 and he works in [employer] area. Patient is wanting to see if he can switch his appointment to a virtual. Is this possible or does he need to reschedule. Please advise.  ?

## 2021-09-02 ENCOUNTER — Ambulatory Visit: Payer: PRIVATE HEALTH INSURANCE | Admitting: Gastroenterology

## 2021-10-07 ENCOUNTER — Ambulatory Visit: Payer: PRIVATE HEALTH INSURANCE | Admitting: Gastroenterology

## 2021-11-15 ENCOUNTER — Ambulatory Visit: Payer: PRIVATE HEALTH INSURANCE | Admitting: Gastroenterology

## 2021-11-16 ENCOUNTER — Encounter: Payer: Self-pay | Admitting: Gastroenterology

## 2022-01-03 ENCOUNTER — Encounter: Payer: Self-pay | Admitting: Gastroenterology

## 2022-01-03 ENCOUNTER — Ambulatory Visit (INDEPENDENT_AMBULATORY_CARE_PROVIDER_SITE_OTHER): Payer: PRIVATE HEALTH INSURANCE | Admitting: Gastroenterology

## 2022-01-03 VITALS — BP 138/80 | HR 66 | Ht 71.0 in | Wt 175.0 lb

## 2022-01-03 DIAGNOSIS — R438 Other disturbances of smell and taste: Secondary | ICD-10-CM

## 2022-01-03 NOTE — Patient Instructions (Addendum)
If you are age 28 or older, your body mass index should be between 23-30. Your Body mass index is 24.41 kg/m. If this is out of the aforementioned range listed, please consider follow up with your Primary Care Provider.  If you are age 24 or younger, your body mass index should be between 19-25. Your Body mass index is 24.41 kg/m. If this is out of the aformentioned range listed, please consider follow up with your Primary Care Provider.   ________________________________________________________  The [facility] providers would like to encourage you to use [facility] to communicate with providers for non-urgent requests or questions.  Due to long hold times on the telephone, sending your provider a message by [facility] may be a faster and more efficient way to get a response.  Please allow 48 business hours for a response.  Please remember that this is for non-urgent requests.  _______________________________________________________  Stop you pantoprazole.  I appreciate the opportunity to care for you. Claudette Head, MD

## 2022-01-03 NOTE — Progress Notes (Signed)
     Assessment     Bad taste in mouth and mild intermittent chest pain, etiologies unclear Unlikely significant GERD    Recommendations    DC pantoprazole. Pepcid ac bid prn.  Follow up with PCP GI follow up prn   HPI    This is a 26 year old male with possible mild GERD with symptoms of mild intermittent chest pain and a bad taste in his mouth. Both symptoms are intermittently present and do not change with or without taking a PPI. He note occasional mild epigastric burning also not clearly helped with a PPI.  He relates he traveled to [place] for several days and while in [place] ate and drank more than usual without any chest or mouth symptoms.   Labs / Imaging       Latest Ref Rng & Units 02/04/2019    2:45 PM 07/15/2014    3:48 PM  Hepatic Function  Total Protein 6.0 - 8.3 g/dL 6.8  7.1   Albumin 3.5 - 5.2 g/dL 4.4  4.4   AST 0 - 37 U/L 11  15   ALT 0 - 53 U/L 7  8   Alk Phosphatase 39 - 117 U/L 70  76   Total Bilirubin 0.2 - 1.2 mg/dL 0.7  1.4   Bilirubin, Direct 0.0 - 0.3 mg/dL 0.2  0.3        Latest Ref Rng & Units 02/04/2019    2:45 PM 07/15/2014    3:48 PM  CBC  WBC 4.0 - 10.5 K/uL 4.5  6.0   Hemoglobin 13.0 - 17.0 g/dL 14.1  14.8   Hematocrit 39.0 - 52.0 % 41.2  42.9   Platelets 150.0 - 400.0 K/uL 240.0  274.0     Current Medications, Allergies, Past Medical History, Past Surgical History, Family History and Social History were reviewed in [employer] record.   Physical Exam: General: Well developed, well nourished, no acute distress Head: Normocephalic and atraumatic Eyes: Sclerae anicteric, EOMI Ears: Normal auditory acuity Mouth: Not examined Lungs: Clear throughout to auscultation Heart: Regular rate and rhythm; no murmurs, rubs or bruits Abdomen: Soft, non tender and non distended. No masses, hepatosplenomegaly or hernias noted. Normal Bowel sounds Rectal: Not done Musculoskeletal: Symmetrical with no gross deformities   Pulses:  Normal pulses noted Extremities: No clubbing, cyanosis, edema or deformities noted Neurological: Alert oriented x 4, grossly nonfocal Psychological:  Alert and cooperative. Normal mood and affect   Wilfrid Hyser T. Fuller Plan, MD 01/03/2022, 3:27 PM

## 2022-02-02 ENCOUNTER — Encounter: Payer: Self-pay | Admitting: Gastroenterology

## 2022-06-28 ENCOUNTER — Encounter: Payer: Self-pay | Admitting: Internal Medicine

## 2022-07-13 ENCOUNTER — Ambulatory Visit (INDEPENDENT_AMBULATORY_CARE_PROVIDER_SITE_OTHER): Payer: PRIVATE HEALTH INSURANCE | Admitting: Internal Medicine

## 2022-07-13 ENCOUNTER — Encounter: Payer: Self-pay | Admitting: Internal Medicine

## 2022-07-13 ENCOUNTER — Other Ambulatory Visit (HOSPITAL_COMMUNITY)
Admission: RE | Admit: 2022-07-13 | Discharge: 2022-07-13 | Disposition: A | Discharge status: Home / Self Care | Payer: No Typology Code available for payment source | Source: Ambulatory Visit | Attending: Internal Medicine | Admitting: Internal Medicine

## 2022-07-13 VITALS — BP 148/100 | HR 72 | Temp 98.2°F | Ht 71.0 in | Wt 177.8 lb

## 2022-07-13 DIAGNOSIS — Z113 Encounter for screening for infections with a predominantly sexual mode of transmission: Secondary | ICD-10-CM

## 2022-07-13 DIAGNOSIS — R438 Other disturbances of smell and taste: Secondary | ICD-10-CM | POA: Diagnosis not present

## 2022-07-13 DIAGNOSIS — R109 Unspecified abdominal pain: Secondary | ICD-10-CM

## 2022-07-13 DIAGNOSIS — R030 Elevated blood-pressure reading, without diagnosis of hypertension: Secondary | ICD-10-CM | POA: Insufficient documentation

## 2022-07-13 DIAGNOSIS — F418 Other specified anxiety disorders: Secondary | ICD-10-CM

## 2022-07-13 DIAGNOSIS — F439 Reaction to severe stress, unspecified: Secondary | ICD-10-CM | POA: Insufficient documentation

## 2022-07-13 DIAGNOSIS — R4589 Other symptoms and signs involving emotional state: Secondary | ICD-10-CM

## 2022-07-13 LAB — POCT URINALYSIS DIPSTICK
Bilirubin, UA: NEGATIVE
Blood, UA: NEGATIVE
Glucose, UA: NEGATIVE
Ketones, UA: POSITIVE
Leukocytes, UA: NEGATIVE
Nitrite, UA: NEGATIVE
Protein, UA: NEGATIVE
Spec Grav, UA: 1.025 (ref 1.010–1.025)
Urobilinogen, UA: 0.2 E.U./dL
pH, UA: 6 (ref 5.0–8.0)

## 2022-07-13 NOTE — Progress Notes (Signed)
Chief Complaint  Patient presents with   Abdominal Pain    Pt reports he is not experiencing abdominal pain but discomfort and feels something is there on RUQ travel down to RLQ. He added around Jan. 10, he was in a lot of distress and didn't have bowel movement for few days. Took GasX, sx resolved.     HPI: Brandon Buckley 27 y.o. come in for   weird feeling  r side  without pain  off and on  began about a year ago. ? If stress or gas    not currently . No vomiting or weight loss  Had episode of dec bms for 3-4 days  2 dogs passes a few weeks ago  and lesss eating and then began sx again.   Tends to worry about health a lot  for many Brantley sleep felt dark.   2 weeks ago Jan 22  racing mind and felt dark.   Want to sleep Jan 5  worked up when watching tv movie.     Habits: Up until 3 am  till 11 12 .  Running business  with dog .  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  Mom can get HBP  and depression post partum.  ROS: See pertinent positives and negatives per HPI. No delusions paranoia etc. Just worries a lot .  Hh of 2  GF  Etoh over holidays but no currently  Daily weed pretty much for a few years  stopped for a few months and made no difference in bad taste.  To go to dentist this week.  Also asks for sti testing just in case  no sx and no hig risk encounter  Neg ivd Past Medical History:  Diagnosis Date   ALLERGIC RHINITIS 03/06/2009   Qualifier: Diagnosis of  By: Sarajane Jews MD, Ishmael Holter    Classic migraine with aura 10/11/2011   GERD (gastroesophageal reflux disease)    GYNECOMASTIA 03/06/2009   Qualifier: Diagnosis of  By: Sarajane Jews MD, Ishmael Holter    Vascular headache 10/11/2011    Family History  Problem Relation Age of Onset   Migraines Mother    Hypertension Mother    Hypertension Other        both grandparents   Diabetes type II Other        both grandparents.    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  Tobacco Use   Smoking status: Never   Smokeless tobacco: Never  Vaping Use   Vaping Use: Never used  Substance and Sexual Activity   Alcohol use: Yes    Comment: social   Drug use: Yes    Types: Marijuana   Sexual activity: Not on file  Other Topics Concern   Not on file  Social History Narrative   hh of 5    3 dogs    No ets.      Work at [employer] and hours vary and then an evening job is moving  student [employer]   Financial planner   Social Determinants of Health   Financial Resource Strain: Not on file  Food Insecurity: Not on file  Transportation Needs: Not on file  Physical Activity: Not on file  Stress: Not on file  Social Connections: Not on file    No outpatient medications prior to visit.   No facility-administered medications prior to visit.     EXAM:  BP (!) 148/100 (BP Location: Left Arm, Cuff Size: Large)   Pulse 72   Temp 98.2 F (36.8 C) (Oral)   Ht 5\' 11"  (1.803 m)   Wt 177 lb 12.8 oz (80.6 kg)   SpO2 99%   BMI 24.80 kg/m   Body mass index is 24.8 kg/m.  GENERAL: vitals reviewed and listed above, alert, oriented, appears well hydrated and in no acute distress HEENT: atraumatic, conjunctiva  clear, no obvious abnormalities on inspection of external nose and ears OP : no lesion edema or exudate  NECK: no obvious masses on inspection palpation  LUNGS: clear to auscultation bilaterally, no wheezes, rales or rhonchi, good air movement CV: HRRR, no clubbing cyanosis or  peripheral edema nl cap refill  Abdomen:  Sof,t normal bowel sounds without hepatosplenomegaly, no guarding rebound or masses no CVA tenderness MS: moves all extremities without noticeable focal   abnormality PSYCH: pleasant and cooperative, no obvious depression mildly anxious  Lab Results  Component Value Date   WBC 4.5 02/04/2019   HGB 14.1 02/04/2019   HCT 41.2 02/04/2019   PLT 240.0 02/04/2019   GLUCOSE 84 02/04/2019   CHOL 157 02/04/2019   TRIG 39.0 02/04/2019   HDL 45.60 02/04/2019   LDLCALC 104 (H) 02/04/2019   ALT 7 02/04/2019   AST 11 02/04/2019   NA 142 02/04/2019   K 3.6 02/04/2019   CL 104 02/04/2019   CREATININE 0.81 02/04/2019   BUN 10 02/04/2019   CO2 30 02/04/2019   TSH 2.43 02/04/2019   HGBA1C 5.5 02/04/2019   BP Readings from Last 3 Encounters:  07/13/22 (!) 148/100  01/03/22 138/80  07/06/20 128/70    ASSESSMENT AND PLAN:  Discussed the following assessment and plan:  Abdominal discomfort - Plan: POC Urinalysis Dipstick, Basic metabolic panel, TSH, Lipid panel, Hemoglobin A1c, Hepatic function panel, CBC with Differential/Platelet, RPR, HIV Antibody (routine testing w rflx), Hepatitis C antibody, Urine cytology ancillary only, T4, Free  Elevated blood pressure reading - Plan: Basic metabolic panel, TSH, Lipid panel, Hemoglobin A1c, Hepatic function panel, CBC with Differential/Platelet, RPR, HIV Antibody (routine testing w rflx), Hepatitis C antibody, Urine cytology ancillary only, T4, Free  Anxiety about health  Bad taste in mouth - Plan: Basic metabolic panel, TSH, Lipid panel, Hemoglobin A1c, Hepatic function panel, CBC with Differential/Platelet, RPR, HIV Antibody (routine testing w rflx), Hepatitis C antibody, Urine cytology ancillary only, T4, Free  Stress - Plan: Basic metabolic panel, TSH, Lipid panel, Hemoglobin A1c, Hepatic function panel, CBC with Differential/Platelet, RPR, HIV Antibody (routine testing w rflx), Hepatitis C antibody, Urine cytology ancillary only, T4, Free  Routine screening for STI (sexually transmitted infection) - Plan: Hepatic function panel, CBC with Differential/Platelet, RPR, HIV Antibody (routine testing w  rflx), Hepatitis C antibody, Urine cytology ancillary only Overall normal exam but elevated blood pressure does need to be addressed and followed up discussed this with patient he will get his own monitor.  But would avoid focusing and anxiety about blood pressure readings. At this point counseling would be difficult because of location and driving although  he may be amenable to televisits. Medications can be used for anxiety but at this point I do not think it would be more positive than negative. He does worry a lot about his health. I still suggest to stop daily marijuana products inhalation as he did in the past. Will order abdominal ultrasound because of the recurrent right upper sided discomfort although could be related to past episode of constipation his current bowel habits are normal without blood or concern. Plan follow-up in about 3 months in regard to blood pressure readings, abdominal symptoms, anxiety, -Patient advised to return or notify health care team  if  new concerns arise. History face-to-face evaluation counseling and plan 50 minutes Patient Instructions  Your BP is up today.  Needs fu .  Goal  below 130/80 .  Take blood pressure readings twice a day for 3-5  days and record .     Take 2 -3 readings at each sitting .   Can send in readings  by My Chart.     Before checking your blood pressure make sure: You are seated and quite for 5 min before checking Feet are flat on the floor Siting in chair with your back supported straight up and down Arm resting on table or arm of chair at heart level Bladder is empty You have NOT had caffeine or tobacco within the last 30 min  [email or web address]  Lab today  your exam is normal except BP is elevated.  You should be contacted about the abdominal ultrasound test.  Suspect will be normal . Consider counselor  for the anxiety  check into cognitinve  behavioral therapy  for worries.     Standley Brooking. Azalia Neuberger M.D.

## 2022-07-13 NOTE — Patient Instructions (Addendum)
Your BP is up today.  Needs fu .  Goal  below 130/80 .  Take blood pressure readings twice a day for 3-5  days and record .     Take 2 -3 readings at each sitting .   Can send in readings  by My Chart.     Before checking your blood pressure make sure: You are seated and quite for 5 min before checking Feet are flat on the floor Siting in chair with your back supported straight up and down Arm resting on table or arm of chair at heart level Bladder is empty You have NOT had caffeine or tobacco within the last 30 min  [email or web address]  Lab today  your exam is normal except BP is elevated.  You should be contacted about the abdominal ultrasound test.  Suspect will be normal . Consider counselor  for the anxiety  check into cognitinve  behavioral therapy  for worries.

## 2022-07-14 ENCOUNTER — Encounter: Payer: Self-pay | Admitting: Internal Medicine

## 2022-07-14 DIAGNOSIS — R109 Unspecified abdominal pain: Secondary | ICD-10-CM

## 2022-07-14 LAB — BASIC METABOLIC PANEL
BUN: 8 mg/dL (ref 6–23)
CO2: 27 mEq/L (ref 19–32)
Calcium: 9.4 mg/dL (ref 8.4–10.5)
Chloride: 103 mEq/L (ref 96–112)
Creatinine, Ser: 0.79 mg/dL (ref 0.40–1.50)
GFR: 120.66 mL/min (ref >60.00)
Glucose, Bld: 84 mg/dL (ref 70–99)
Potassium: 3.8 mEq/L (ref 3.5–5.1)
Sodium: 140 mEq/L (ref 135–145)

## 2022-07-14 LAB — CBC WITH DIFFERENTIAL/PLATELET
Basophils Absolute: 0.1 10*3/uL (ref 0.0–0.1)
Basophils Relative: 1.3 % (ref 0.0–3.0)
Eosinophils Absolute: 0.1 10*3/uL (ref 0.0–0.7)
Eosinophils Relative: 2.5 % (ref 0.0–5.0)
HCT: 42.3 % (ref 39.0–52.0)
Hemoglobin: 14.5 g/dL (ref 13.0–17.0)
Lymphocytes Relative: 50.5 % — ABNORMAL HIGH (ref 12.0–46.0)
Lymphs Abs: 3 10*3/uL (ref 0.7–4.0)
MCHC: 34.2 g/dL (ref 30.0–36.0)
MCV: 91.8 fl (ref 78.0–100.0)
Monocytes Absolute: 0.4 10*3/uL (ref 0.1–1.0)
Monocytes Relative: 6.8 % (ref 3.0–12.0)
Neutro Abs: 2.3 10*3/uL (ref 1.4–7.7)
Neutrophils Relative %: 38.9 % — ABNORMAL LOW (ref 43.0–77.0)
Platelets: 272 10*3/uL (ref 150.0–400.0)
RBC: 4.61 Mil/uL (ref 4.22–5.81)
RDW: 13.5 % (ref 11.5–15.5)
WBC: 5.9 10*3/uL (ref 4.0–10.5)

## 2022-07-14 LAB — HEPATITIS C ANTIBODY: Hepatitis C Ab: NONREACTIVE

## 2022-07-14 LAB — LIPID PANEL
Cholesterol: 186 mg/dL (ref 0–200)
HDL: 57.5 mg/dL (ref >39.00)
LDL Cholesterol: 120 mg/dL — ABNORMAL HIGH (ref 0–99)
NonHDL: 128.36
Total CHOL/HDL Ratio: 3
Triglycerides: 43 mg/dL (ref 0.0–149.0)
VLDL: 8.6 mg/dL (ref 0.0–40.0)

## 2022-07-14 LAB — HEPATIC FUNCTION PANEL
ALT: 18 U/L (ref 0–53)
AST: 14 U/L (ref 0–37)
Albumin: 4.5 g/dL (ref 3.5–5.2)
Alkaline Phosphatase: 53 U/L (ref 39–117)
Bilirubin, Direct: 0.2 mg/dL (ref 0.0–0.3)
Total Bilirubin: 0.7 mg/dL (ref 0.2–1.2)
Total Protein: 6.9 g/dL (ref 6.0–8.3)

## 2022-07-14 LAB — HIV ANTIBODY (ROUTINE TESTING W REFLEX): HIV 1&2 Ab, 4th Generation: NONREACTIVE

## 2022-07-14 LAB — TSH: TSH: 2.61 u[IU]/mL (ref 0.35–5.50)

## 2022-07-14 LAB — HEMOGLOBIN A1C: Hgb A1c MFr Bld: 5.2 % (ref 4.6–6.5)

## 2022-07-14 LAB — RPR: RPR Ser Ql: NONREACTIVE

## 2022-07-14 LAB — T4, FREE: Free T4: 0.74 ng/dL (ref 0.60–1.60)

## 2022-07-15 LAB — URINE CYTOLOGY ANCILLARY ONLY
Chlamydia: NEGATIVE
Comment: NEGATIVE
Comment: NORMAL
Neisseria Gonorrhea: NEGATIVE

## 2022-07-15 NOTE — Telephone Encounter (Signed)
Blood results are stable or normal ldl cholesterol up slightly.  The blood count is not concerning  even though out of range for percent of cell type   . Look at the absolutes of the cell types totally normal and in range . Percents are relative and many people fall into this range as normal for them .  The fact that  you are not anemic and platelets are normal are also reassuring . You could get a blood count yearly  to follow  So the rest of the blood count is totally normal .  The liver thyroid kidney and blood sugar tests are all normal   neg STI blood work  .  Again reassuring . Will await the abdominal ultrasound  but expect to be normal .  So work on healthy eating lifestyle anxiety reduction as we discussed .    So... no concerning findings .Marland KitchenMarland Kitchen

## 2022-07-17 NOTE — Progress Notes (Signed)
Neg  screen for sti urine.

## 2022-07-19 NOTE — Telephone Encounter (Signed)
Pt is calling again and it we call tomorrow please call after 2 pm

## 2022-07-19 NOTE — Telephone Encounter (Signed)
Pt called, returning CMA's call. CMA was with a patient. Pt asked that CMA call back at her earliest convenience.

## 2022-07-19 NOTE — Telephone Encounter (Signed)
Please place order if none in system  Abd ultrasound  for right side pain

## 2022-07-20 NOTE — Telephone Encounter (Signed)
Abd u/s order is placed.

## 2022-07-27 ENCOUNTER — Ambulatory Visit (HOSPITAL_BASED_OUTPATIENT_CLINIC_OR_DEPARTMENT_OTHER): Admission: RE | Admit: 2022-07-27 | Payer: No Typology Code available for payment source | Source: Ambulatory Visit

## 2022-07-28 ENCOUNTER — Encounter: Payer: Self-pay | Admitting: Internal Medicine

## 2022-07-29 ENCOUNTER — Other Ambulatory Visit: Payer: Self-pay | Admitting: Family

## 2022-07-29 DIAGNOSIS — R109 Unspecified abdominal pain: Secondary | ICD-10-CM

## 2022-08-11 NOTE — Telephone Encounter (Signed)
Resent to [phone] .

## 2022-09-05 ENCOUNTER — Encounter: Payer: Self-pay | Admitting: Internal Medicine

## 2022-10-04 ENCOUNTER — Encounter: Payer: Self-pay | Admitting: Internal Medicine

## 2022-10-11 ENCOUNTER — Ambulatory Visit: Payer: PRIVATE HEALTH INSURANCE | Admitting: Internal Medicine

## 2023-01-11 ENCOUNTER — Ambulatory Visit: Payer: No Typology Code available for payment source | Admitting: Internal Medicine

## 2023-02-24 ENCOUNTER — Telehealth: Payer: No Typology Code available for payment source | Admitting: Physician Assistant

## 2023-02-24 DIAGNOSIS — B958 Unspecified staphylococcus as the cause of diseases classified elsewhere: Secondary | ICD-10-CM | POA: Diagnosis not present

## 2023-02-24 DIAGNOSIS — L089 Local infection of the skin and subcutaneous tissue, unspecified: Secondary | ICD-10-CM | POA: Diagnosis not present

## 2023-02-24 DIAGNOSIS — L282 Other prurigo: Secondary | ICD-10-CM | POA: Diagnosis not present

## 2023-02-24 MED ORDER — SULFAMETHOXAZOLE-TRIMETHOPRIM 800-160 MG PO TABS
1.0000 | ORAL_TABLET | Freq: Two times a day (BID) | ORAL | 0 refills | Status: DC
Start: 2023-02-24 — End: 2023-03-18

## 2023-02-24 MED ORDER — PREDNISONE 10 MG (21) PO TBPK
ORAL_TABLET | ORAL | 0 refills | Status: DC
Start: 2023-02-24 — End: 2023-04-05

## 2023-02-24 NOTE — Addendum Note (Signed)
Addended by: Margaretann Loveless on: 02/24/2023 07:18 PM   Modules accepted: Orders

## 2023-02-24 NOTE — Progress Notes (Signed)

## 2023-02-28 ENCOUNTER — Ambulatory Visit: Payer: No Typology Code available for payment source | Admitting: Family Medicine

## 2023-03-18 ENCOUNTER — Telehealth: Payer: No Typology Code available for payment source | Admitting: Family Medicine

## 2023-03-18 DIAGNOSIS — B958 Unspecified staphylococcus as the cause of diseases classified elsewhere: Secondary | ICD-10-CM

## 2023-03-18 DIAGNOSIS — L089 Local infection of the skin and subcutaneous tissue, unspecified: Secondary | ICD-10-CM

## 2023-03-18 MED ORDER — SULFAMETHOXAZOLE-TRIMETHOPRIM 800-160 MG PO TABS
1.0000 | ORAL_TABLET | Freq: Two times a day (BID) | ORAL | 0 refills | Status: DC
Start: 2023-03-18 — End: 2023-04-05

## 2023-03-18 NOTE — Progress Notes (Signed)
E-Visit for Cellulitis  We are sorry that you are not feeling well. Here is how we plan to help!  I am unsure why this keeps occurring, but I recommend following up with your PCP for further evaluation and testing of the area.  This may help figure out the cause of these pimple infections.  I will send another dose of antibiotics but please follow up with your PCP.   Based on what you shared with me it looks like you have cellulitis.  Cellulitis looks like areas of skin redness, swelling, and warmth; it develops as a result of bacteria entering under the skin. Little red spots and/or bleeding can be seen in skin, and tiny surface sacs containing fluid can occur. Fever can be present. Cellulitis is almost always on one side of a body, and the lower limbs are the most common site of involvement.   I have prescribed:  Bactrim DS 1 tablet by mouth twice a day for 7 days  HOME CARE:  Take your medications as ordered and take all of them, even if the skin irritation appears to be healing.   GET HELP RIGHT AWAY IF:  Symptoms that don't begin to go away within 48 hours. Severe redness persists or worsens If the area turns color, spreads or swells. If it blisters and opens, develops yellow-brown crust or bleeds. You develop a fever or chills. If the pain increases or becomes unbearable.  Are unable to keep fluids and food down.  MAKE SURE YOU   Understand these instructions. Will watch your condition. Will get help right away if you are not doing well or get worse.  Thank you for choosing an e-visit.  Your e-visit answers were reviewed by a board certified advanced clinical practitioner to complete your personal care plan. Depending upon the condition, your plan could have included both over the counter or prescription medications.  Please review your pharmacy choice. Make sure the pharmacy is open so you can pick up prescription now. If there is a problem, you may contact your provider  through [employer] and have the prescription routed to another pharmacy.  Your safety is important to [place]. If you have drug allergies check your prescription carefully.   For the next 24 hours you can use MyChart to ask questions about today's visit, request a non-urgent call back, or ask for a work or school excuse. You will get an email in the next two days asking about your experience. I hope that your e-visit has been valuable and will speed your recovery.  I have spent 5 minutes in review of e-visit questionnaire, review and updating patient chart, medical decision making and response to patient.   Reed Pandy, PA-C

## 2023-04-05 ENCOUNTER — Ambulatory Visit: Payer: No Typology Code available for payment source | Admitting: Internal Medicine

## 2023-04-05 ENCOUNTER — Encounter: Payer: Self-pay | Admitting: Internal Medicine

## 2023-04-05 VITALS — BP 130/86 | HR 60 | Temp 98.4°F | Ht 70.75 in | Wt 183.0 lb

## 2023-04-05 DIAGNOSIS — Z23 Encounter for immunization: Secondary | ICD-10-CM | POA: Diagnosis not present

## 2023-04-05 DIAGNOSIS — R21 Rash and other nonspecific skin eruption: Secondary | ICD-10-CM

## 2023-04-05 DIAGNOSIS — R030 Elevated blood-pressure reading, without diagnosis of hypertension: Secondary | ICD-10-CM | POA: Diagnosis not present

## 2023-04-05 DIAGNOSIS — Z0000 Encounter for general adult medical examination without abnormal findings: Secondary | ICD-10-CM

## 2023-04-05 MED ORDER — FLUOCINONIDE 0.05 % EX CREA: 1.0000 | TOPICAL_CREAM | Freq: Two times a day (BID) | CUTANEOUS | 0 refills | Status: AC

## 2023-04-05 MED ORDER — MUPIROCIN CALCIUM 2 % EX CREA: 1.0000 | TOPICAL_CREAM | Freq: Two times a day (BID) | CUTANEOUS | 1 refills | Status: AC

## 2023-04-05 NOTE — Patient Instructions (Addendum)
Please bring your blood pressure cuff to next appointment Take blood pressure readings twice a day for 5-7 days and record .     Take 2 -3 readings at each sitting .   Can send in readings  by My Chart.    Before checking your blood pressure make sure: You are seated and quite for 5 min before checking Feet are flat on the floor Siting in chair with your back supported straight up and down Arm resting on table or arm of chair at heart level Bladder is empty You have NOT had caffeine or tobacco within the last 30 min  [email or web address]  Can use warm compresses for new bumps  and add topical antibiotic  antibacterial soap .  If progressing boils send in Picture and we may add antibiotic such as doxycycline for skin infection  We can treats as we go along  and often resolves with time.    I will send in topical for infection  as needed Also  stronger cortisone to the leg rash to help stop the itching .   Send in message about how doing in a month or if getting worse

## 2023-04-05 NOTE — Progress Notes (Signed)
Chief Complaint  Patient presents with   Annual Exam    Would like to talk to provider about his rash. Rx bactrim, prednisone. Had telehealth a month ago and 2 wks ago.     HPI: Patient  Brandon Buckley  27 y.o. comes in today for Preventive Health Care visit  and couple of issues   Has  had e visit for skin infections  last 2 mos   and gi for gi sx better   was given bactrim for 7 says  (and prev prednisone  9 24 not taken ) bactrim helped but some come back areas lower leg knees and on on left forearm. No fever  ? Obv bug bites  other  Still has patch of rash eczema patch on right le  using otc   Has a bit of anxiety about health lately after friend passed from cancer  but much better  now.   Recently bought house [place] and moved . Family hx of  Ht both parents   Health Maintenance  Topic Date Due   COVID-19 Vaccine (1) 04/21/2023 (Originally 09/29/1998)   INFLUENZA VACCINE  09/04/2023 (Originally 01/05/2023)   DTaP/Tdap/Td (3 - Td or Tdap) 04/04/2033   Hepatitis C Screening  Completed   HIV Screening  Completed   HPV VACCINES  Aged Out   Health Maintenance Review LIFESTYLE:  Exercise:  active  walking   recent  purchased home Tobacco/ETS: no Alcohol: not since [place]  Sugar beverages: Sleep:  about 8 hours  Drug use: weed. Daily inhaled  for long time helps  anxiety no obv se  HH of   2   lives with GF pet 3 dogs and litter  sometimes  Work: so  ave 7-8 hours per day.  7 days per week    ROS:  GEN/ HEENT: No fever, significant weight changes sweats headaches vision problems hearing changes, CV/ PULM; No chest pain shortness of breath cough, syncope,edema  change in exercise tolerance. GI /GU: No adominal pain, vomiting, change in bowel habits. No blood in the stool. No significant GU symptoms. SKIN/HEME: ,no acute skin rashes suspicious lesions or bleeding. No lymphadenopathy, nodules, masses.  NEURO/ PSYCH:  No neurologic signs such as weakness numbness.  IMM/ Allergy:  No unusual infections.  Allergy .   REST of 12 system review negative except as per HPI   Past Medical History:  Diagnosis Date   ALLERGIC RHINITIS 03/06/2009   Qualifier: Diagnosis of  By: Clent Ridges MD, Tera Mater    Classic migraine with aura 10/11/2011   GERD (gastroesophageal reflux disease)    GYNECOMASTIA 03/06/2009   Qualifier: Diagnosis of  By: Clent Ridges MD, Tera Mater    Vascular headache 10/11/2011    Past Surgical History:  Procedure Laterality Date   WISDOM TOOTH EXTRACTION      Family History  Problem Relation Age of Onset   Migraines Mother    Hypertension Mother    Hypertension Other        both grandparents   Diabetes type II Other        both grandparents.    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   Sexual activity: Not on file  Other Topics Concern   Not on file  Social History Narrative   hh of 5    3 dogs    No ets.      Work at [employer] and hours vary and then an evening job is moving       Psychologist, occupational   Social Determinants of Health   Financial Resource Strain: Low Risk  (04/05/2023)   Overall Financial Resource Strain (CARDIA)    Difficulty of Paying Living Expenses: Not hard at all  Food Insecurity: Not on file  Transportation Needs: Not on file  Physical Activity: Insufficiently Active (04/05/2023)   Exercise Vital Sign    Days of Exercise  per Week: 2 days    Minutes of Exercise per Session: 30 min  Stress: Stress Concern Present (04/05/2023)   [employer] of Occupational Health - Occupational Stress Questionnaire    Feeling of Stress : To some extent  Social Connections: Moderately Isolated (04/05/2023)   Social Connection and Isolation Panel [NHANES]    Frequency of Communication with Friends and Family: More than three times a week    Frequency of Social Gatherings with Friends and Family: Never    Attends Religious Services: Never    Database administrator or Organizations: No    Attends Engineer, structural: Never    Marital Status: Living with partner    Outpatient Medications Prior to Visit  Medication Sig Dispense Refill   predniSONE (STERAPRED UNI-PAK 21 TAB) 10 MG (21) TBPK tablet 6 day taper; take as directed on package instructions (Patient not taking: Reported on 04/05/2023) 21 tablet 0   sulfamethoxazole-trimethoprim (BACTRIM DS) 800-160 MG tablet Take 1 tablet by mouth 2 (two) times daily. (Patient not taking: Reported on 04/05/2023) 14 tablet 0   No facility-administered medications prior to visit.     EXAM:  BP 130/86 (BP Location: Left Arm, Patient Position: Sitting, Cuff Size: Large)   Pulse 60   Temp 98.4 F (36.9 C) (Oral)   Ht 5' 10.75" (1.797 m)   Wt 183 lb (83 kg)   SpO2 98%   BMI 25.70 kg/m   Body mass index is 25.7 kg/m. Wt Readings from Last 3 Encounters:  04/05/23 183 lb (83 kg)  07/13/22 177 lb 12.8 oz (80.6 kg)  01/03/22 175 lb (79.4 kg)    Physical Exam: Vital signs reviewed ZOX:[id number] is a well-developed well-nourished alert cooperative    who appearsr stated age in no acute distress.  HEENT: normocephalic atraumatic , Eyes: PERRL EOM's full, conjunctiva clear, Nares: paten,t no deformity discharge or tenderness., Ears: no deformity EAC's clear TMs with normal landmarks. Mouth: clear OP, no lesions, edema.  Moist mucous membranes. Dentition in adequate  repair. NECK: supple without masses, thyromegaly or bruits. CHEST/PULM:  Clear to auscultation and percussion breath sounds equal no wheeze , rales or rhonchi. No chest wall deformities or tenderness. CV: PMI is nondisplaced, S1 S2 no gallops, murmurs, rubs. Peripheral pulses are full without  delay.No JVD .  ABDOMEN: Bowel sounds normal nontender  No guard or rebound, no hepato splenomegal no CVA tenderness.  Extremtities:  No clubbing cyanosis or edema, no acute joint swelling or redness no focal atrophy NEURO:  Oriented x3, cranial nerves 3-12 appear to be intact, no obvious focal weakness,gait within normal limits no abnormal reflexes or asymmetrical SKIN: No acute rashes  hypertrophied patch right le   abaout 6 cm no ulcers or  discharge normal turgor, color, no bruising or petechiae. Fading bumps ( ? Prv boild) r knee left thigh  left forearm no pustules or  vesicles  or redness  PSYCH: Oriented, good eye contact, no obvious depression anxiety, cognition and judgment appear normal. LN: no cervical axillary inguinal adenopathy  Lab Results  Component Value Date   WBC 5.9 07/13/2022   HGB 14.5 07/13/2022   HCT 42.3 07/13/2022   PLT 272.0 07/13/2022   GLUCOSE 84 07/13/2022   CHOL 186 07/13/2022   TRIG 43.0 07/13/2022   HDL 57.50 07/13/2022   LDLCALC 120 (H) 07/13/2022   ALT 18 07/13/2022   AST 14 07/13/2022   NA 140 07/13/2022   K 3.8 07/13/2022   CL 103 07/13/2022   CREATININE 0.79 07/13/2022   BUN 8 07/13/2022   CO2 27 07/13/2022   TSH 2.61 07/13/2022   HGBA1C 5.2 07/13/2022    BP Readings from Last 3 Encounters:  04/05/23 130/86  07/13/22 (!) 148/100  01/03/22 138/80  Decline flu vaccine proceed with tdap   Lab results reviewed with patient   ASSESSMENT AND PLAN:  Discussed the following assessment and plan:    ICD-10-CM   1. Visit for preventive health examination  Z00.00     2. Need for tetanus booster  Z23 Tdap vaccine greater than or equal to 7yo IM    3.  Elevated blood pressure reading  R03.0     4. Rash  R21    see text dermatitis and  poss skin infection resolving    One  rash is like a patch of eczematoid dermatitis , chromic   and can try higher dose steroid  lidex   Sounds like  he had a few bouts of skin infection superficial boils   responded to ? Septra?0  disc local care if recurring warm compresses and topical antibiotics and if progressing to contact us by my chart or other and send in pix  and we can add further antibiotic such as doxy for 10 days   Disc I discourage daily weed use for  various reasons and uknown and may effect rebound anxiety other  working memory etc.  BP upper limited today 'disc lsi and risk  better reading on repeat but still not at goal  Send in bp readings  and plan fu if need to add medication in addition to lsi   Return for depending on BP and skin issues .  Patient Care Team: Madelin Headings, MD as PCP - General Patient Instructions  Please bring your blood pressure cuff to next appointment Take blood pressure readings twice a day for 5-7 days and record .     Take 2 -3 readings at each sitting .   Can send in readings  by My Chart.    Before checking your blood pressure make sure: You are seated and quite for 5 min before checking Feet are flat on the floor Siting in chair with your back supported straight up and down Arm resting on table or arm  of chair at heart level Bladder is empty You have NOT had caffeine or tobacco within the last 30 min  [email or web address]  Can use warm compresses for new bumps  and add topical antibiotic  antibacterial soap .  If progressing boils send in Picture and we may add antibiotic such as doxycycline for skin infection  We can treats as we go along  and often resolves with time.    I will send in topical for infection  as needed Also  stronger cortisone to the leg rash to help stop the itching .   Send in message about how doing in a month or if getting  worse     Neta Mends. Waqas Bruhl M.D.

## 2023-08-15 ENCOUNTER — Encounter: Payer: Self-pay | Admitting: Internal Medicine

## 2023-09-13 ENCOUNTER — Ambulatory Visit: Admitting: Internal Medicine

## 2024-03-27 ENCOUNTER — Ambulatory Visit: Admitting: Internal Medicine

## 2024-05-15 ENCOUNTER — Encounter: Payer: Self-pay | Admitting: Internal Medicine

## 2024-06-12 ENCOUNTER — Ambulatory Visit (INDEPENDENT_AMBULATORY_CARE_PROVIDER_SITE_OTHER): Admitting: Internal Medicine

## 2024-06-12 ENCOUNTER — Encounter: Payer: Self-pay | Admitting: Internal Medicine

## 2024-06-12 VITALS — BP 130/76 | HR 76 | Temp 98.3°F | Ht 70.0 in | Wt 194.8 lb

## 2024-06-12 DIAGNOSIS — Z0000 Encounter for general adult medical examination without abnormal findings: Secondary | ICD-10-CM

## 2024-06-12 DIAGNOSIS — M549 Dorsalgia, unspecified: Secondary | ICD-10-CM

## 2024-06-12 DIAGNOSIS — R198 Other specified symptoms and signs involving the digestive system and abdomen: Secondary | ICD-10-CM

## 2024-06-12 NOTE — Progress Notes (Signed)
 "  Chief Complaint  Patient presents with   Annual Exam    Pt is here physical. Would like blood work. Pt reports he still having stomach issues. Was seen with GI. No follow up visit.     HPI: Patient  Brandon Buckley  29 y.o. comes in today for Preventive Health Care visit and also fu of ongoing  lower abd sx   Incomplete evacuation  2 year  off and on  seminormal. t   using  mom about 2 x per week and seems to help .  Has had urologic eval and pelvic pt  ? Not that helpful    Lumbar  pain tightness  and aound to lower abd  after walking   45 mintutes  causing limitation of exercise . Such as running and prolonged walking . Sits during work at home but not  hurting then  no radiation down legs  Trying to do healthy things  Health Maintenance  Topic Date Due   Hepatitis B Vaccines 19-59 Average Risk (1 of 3 - 19+ 3-dose series) Never done   HPV VACCINES (1 - Risk 3-dose SCDM series) Never done   COVID-19 Vaccine (1) 06/28/2024 (Originally 03/30/1996)   Influenza Vaccine  09/03/2024 (Originally 01/05/2024)   DTaP/Tdap/Td (3 - Td or Tdap) 04/04/2033   Hepatitis C Screening  Completed   HIV Screening  Completed   Pneumococcal Vaccine  Aged Out   Meningococcal B Vaccine  Aged Out   Health Maintenance Review LIFESTYLE:  Exercise:  some but  exercise seems to bother his back  Tobacco/ETS: Alcohol: n Sugar beverages:n Sleep:working on it Drug use: no stopped MJ for 6 mos  now some HH of 2  dogs he walks  Work:home   6 hours per day    ROS:  GEN/ HEENT: No fever, significant weight changes sweats headaches vision problems hearing changes, CV/ PULM; No chest pain shortness of breath cough, syncope,edema  change in exercise tolerance. GI /GU: as above  SKIN/HEME: ,no acute skin rashes suspicious lesions or bleeding. No lymphadenopathy, nodules, masses.  NEURO/ PSYCH:  No neurologic signs such as weakness numbness. No depression anxiety. IMM/ Allergy: No unusual infections.  Allergy  .   REST of 12 system review negative except as per HPI   Past Medical History:  Diagnosis Date   ALLERGIC RHINITIS 03/06/2009   Qualifier: Diagnosis of  By: Johnny MD, Garnette LABOR    Classic migraine with aura 10/11/2011   GERD (gastroesophageal reflux disease)    GYNECOMASTIA 03/06/2009   Qualifier: Diagnosis of  By: Johnny MD, Garnette LABOR    Vascular headache 10/11/2011    Past Surgical History:  Procedure Laterality Date   WISDOM TOOTH EXTRACTION      Family History  Problem Relation Age of Onset   Migraines Mother    Hypertension Mother    Hypertension Other        both grandparents   Diabetes type II Other        both grandparents.    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   Sexual activity: Not on file  Other Topics Concern   Not on file  Social History Narrative   hh of 5    3 dogs    No ets.      Work at [employer] and hours vary and then an evening job is moving       Psychologist, Occupational   Social Drivers of Health   Tobacco Use: Low Risk (06/12/2024)   Patient History    Smoking Tobacco Use: Never    Smokeless Tobacco Use: Never    Passive Exposure: Not on file  Financial Resource Strain: Low Risk (04/05/2023)   Overall Financial Resource Strain (CARDIA)    Difficulty of Paying Living Expenses: Not hard at all  Food Insecurity: Not on file  Transportation Needs:  Not on file  Physical Activity: Insufficiently Active (04/05/2023)   Exercise Vital Sign    Days of Exercise per Week: 2 days    Minutes of Exercise per Session: 30 min  Stress: Stress Concern Present (04/05/2023)   [employer] of Occupational Health - Occupational Stress Questionnaire    Feeling of Stress : To some extent  Social Connections: Moderately Isolated (04/05/2023)   Social Connection and Isolation Panel    Frequency of Communication with Friends and Family: More than three times a week    Frequency of Social Gatherings with Friends and Family: Never    Attends Religious Services: Never    Database Administrator or Organizations: No    Attends Banker Meetings: Never    Marital Status: Living with partner  Depression (PHQ2-9): Medium Risk (06/12/2024)   Depression (PHQ2-9)    PHQ-2 Score: 5  Alcohol Screen: Low Risk (04/05/2023)   Alcohol Screen    Last Alcohol Screening Score (AUDIT): 1  Housing: Not on file  Utilities: Not At Risk (04/05/2023)   AHC Utilities    Threatened with loss of utilities: No  Health Literacy: Not on file    Outpatient Medications Prior to Visit  Medication Sig Dispense Refill   fluocinonide  cream (LIDEX ) 0.05 % Apply 1 Application topically 2 (two) times daily. To itchy dermatitis . Not for infection (Patient not taking: Reported on 06/12/2024) 30 g 0   mupirocin  cream (BACTROBAN ) 2 % Apply 1 Application topically 2 (two) times daily. For skin einfection (Patient not taking: Reported on 06/12/2024) 15 g 1   No facility-administered medications prior to visit.     EXAM:  BP 130/76 (BP Location: Right Arm, Patient Position: Sitting, Cuff Size: Large)   Pulse 76   Temp 98.3 F (36.8 C) (Oral)   Ht 5' 10 (1.778 m)   Wt 194 lb 12.8 oz (88.4 kg)   SpO2 96%   BMI 27.95 kg/m   Body mass index is 27.95 kg/m. Wt Readings from Last 3 Encounters:  06/12/24 194 lb 12.8 oz (88.4 kg)  04/05/23 183 lb (83 kg)  07/13/22 177 lb  12.8 oz (80.6 kg)    Physical Exam: Vital signs reviewed HZW:Uypd is a well-developed well-nourished alert cooperative    who appearsr stated age in no acute distress.  HEENT: normocephalic atraumatic , Eyes: PERRL EOM's full, conjunctiva clear, Nares: paten,t no deformity discharge or tenderness., Ears: no deformity EAC's clear TMs with normal landmarks. Mouth: clear OP, no lesions, edema.  Moist mucous membranes. Dentition in adequate repair. NECK: supple without masses, thyromegaly or bruits. CHEST/PULM:  Clear to auscultation and percussion breath sounds equal no wheeze , rales or rhonchi. No chest wall deformities or tenderness. CV: PMI is nondisplaced, S1 S2 no gallops, murmurs, rubs. Peripheral pulses are full without delay.N.  ABDOMEN: Bowel sounds normal nontender  No guard or rebound, no hepato splenomegal no CVA tenderness.   Extremtities:  No clubbing cyanosis or edema, no acute joint swelling or redness no focal atrophy NEURO:  Oriented x3, cranial nerves 3-12 appear to be intact, no obvious focal weakness,gait within normal limits no abnormal reflexes or asymmetrical SKIN: No acute rashes normal turgor, color, no bruising or petechiae. PSYCH: Oriented, good eye contact, no obvious depression anxiety, cognition and judgment appear normal. LN: no cervical axillary iadenopathy  Lab Results  Component Value Date   WBC 5.9 07/13/2022   HGB 14.5 07/13/2022   HCT 42.3 07/13/2022   PLT 272.0 07/13/2022   GLUCOSE 84 07/13/2022   CHOL 186 07/13/2022   TRIG 43.0 07/13/2022   HDL 57.50 07/13/2022   LDLCALC 120 (H) 07/13/2022   ALT 18 07/13/2022   AST 14 07/13/2022   NA 140 07/13/2022   K 3.8 07/13/2022   CL 103 07/13/2022   CREATININE 0.79 07/13/2022   BUN 8 07/13/2022   CO2 27 07/13/2022   TSH 2.61 07/13/2022   HGBA1C 5.2 07/13/2022    BP Readings from Last 3 Encounters:  06/12/24 130/76  04/05/23 130/86  07/13/22 (!) 148/100    Lab plan eviewed with patient    ASSESSMENT AND PLAN:  Discussed the following assessment and plan:    ICD-10-CM   1. Visit for preventive health examination  Z00.00 Basic metabolic panel with GFR    CBC with Differential/Platelet    Hemoglobin A1c    Hepatic function panel    Lipid panel    TSH    Sedimentation rate    C-reactive protein    2. Back pain, unspecified back location, unspecified back pain laterality, unspecified chronicity  M54.9 Basic metabolic panel with GFR    CBC with Differential/Platelet    Hemoglobin A1c    Hepatic function panel    Lipid panel    TSH    Sedimentation rate    C-reactive protein    Ambulatory referral to Sports Medicine    3. GI symptom  R19.8 Basic metabolic panel with GFR    CBC with Differential/Platelet    Hemoglobin A1c    Hepatic function panel    Lipid panel    TSH    Sedimentation rate    C-reactive protein    Ambulatory referral to Sports Medicine    Incomplete evacuation  for past 2 years .. not helped by diet fiber other   had gi and urology check  no dx  intervention  trying about 2 x pr week Mom  and helps  some  no incontinence and no blood  ok to continue  for now  not harmful  as reported . Wonder if back pain related that is  noted with walking exercise   . NO obvious neuro  deficit  .    Managing   but soul like to refer back to Dr Claudene   for  advice reevaluation because  of his age  and limiting of back pain with exercise .    Could he have an anatomic  abnormality ?   Return in about 1 year (around 06/12/2025) for depending on results or if worse .  Patient Care Team: Jazaria Jarecki K, MD as PCP - General Patient Instructions  Exam is reassuring .   Lab today  I will  do referral to SM as discussed  Ok to do mom 2 x per week but if worse fu   for advice .    Lucilia Yanni K. Claryce Friel M.D.  "

## 2024-06-12 NOTE — Patient Instructions (Signed)
 Exam is reassuring .   Lab today  I will  do referral to SM as discussed  Ok to do mom 2 x per week but if worse fu   for advice .

## 2024-06-13 LAB — CBC WITH DIFFERENTIAL/PLATELET
Basophils Absolute: 0.1 K/uL (ref 0.0–0.1)
Basophils Relative: 1 % (ref 0.0–3.0)
Eosinophils Absolute: 0.2 K/uL (ref 0.0–0.7)
Eosinophils Relative: 2.8 % (ref 0.0–5.0)
HCT: 42.9 % (ref 39.0–52.0)
Hemoglobin: 14.3 g/dL (ref 13.0–17.0)
Lymphocytes Relative: 39.6 % (ref 12.0–46.0)
Lymphs Abs: 2.4 K/uL (ref 0.7–4.0)
MCHC: 33.3 g/dL (ref 30.0–36.0)
MCV: 90 fl (ref 78.0–100.0)
Monocytes Absolute: 0.4 K/uL (ref 0.1–1.0)
Monocytes Relative: 6.4 % (ref 3.0–12.0)
Neutro Abs: 3 K/uL (ref 1.4–7.7)
Neutrophils Relative %: 50.2 % (ref 43.0–77.0)
Platelets: 295 K/uL (ref 150.0–400.0)
RBC: 4.76 Mil/uL (ref 4.22–5.81)
RDW: 13.4 % (ref 11.5–15.5)
WBC: 6.1 K/uL (ref 4.0–10.5)

## 2024-06-13 LAB — HEPATIC FUNCTION PANEL
ALT: 25 U/L (ref 3–53)
AST: 19 U/L (ref 5–37)
Albumin: 4.5 g/dL (ref 3.5–5.2)
Alkaline Phosphatase: 84 U/L (ref 39–117)
Bilirubin, Direct: 0.1 mg/dL (ref 0.1–0.3)
Total Bilirubin: 0.6 mg/dL (ref 0.2–1.2)
Total Protein: 7.3 g/dL (ref 6.0–8.3)

## 2024-06-13 LAB — BASIC METABOLIC PANEL WITH GFR
BUN: 10 mg/dL (ref 6–23)
CO2: 28 meq/L (ref 19–32)
Calcium: 9.4 mg/dL (ref 8.4–10.5)
Chloride: 104 meq/L (ref 96–112)
Creatinine, Ser: 0.84 mg/dL (ref 0.40–1.50)
GFR: 116.86 mL/min
Glucose, Bld: 92 mg/dL (ref 70–99)
Potassium: 4.2 meq/L (ref 3.5–5.1)
Sodium: 138 meq/L (ref 135–145)

## 2024-06-13 LAB — LIPID PANEL
Cholesterol: 184 mg/dL (ref 28–200)
HDL: 50.8 mg/dL
LDL Cholesterol: 120 mg/dL — ABNORMAL HIGH (ref 10–99)
NonHDL: 133.03
Total CHOL/HDL Ratio: 4
Triglycerides: 63 mg/dL (ref 10.0–149.0)
VLDL: 12.6 mg/dL (ref 0.0–40.0)

## 2024-06-13 LAB — HEMOGLOBIN A1C: Hgb A1c MFr Bld: 5.5 % (ref 4.6–6.5)

## 2024-06-13 LAB — C-REACTIVE PROTEIN: CRP: <0.5 mg/dL — ABNORMAL LOW (ref 1.0–20.0)

## 2024-06-13 LAB — TSH: TSH: 1.65 u[IU]/mL (ref 0.35–5.50)

## 2024-06-13 LAB — SEDIMENTATION RATE: Sed Rate: 8 mm/h (ref 0–15)

## 2024-06-15 ENCOUNTER — Ambulatory Visit: Payer: Self-pay | Admitting: Internal Medicine

## 2024-06-15 NOTE — Progress Notes (Signed)
 Blood results   normal in range except ldl cholesterol  not optimum  ( ldl below 100)  stable  and not unfavorable .   Continue attention to healthy diet  avoiding processed foods added sugars and optimizing  exercise as tolerated .

## 2024-06-28 ENCOUNTER — Telehealth: Payer: Self-pay

## 2024-06-28 NOTE — Telephone Encounter (Signed)
 Attempted to reach pt regards to a form from [employer] to fill out from a service on 06/12/2024.  Left a detail message to call us  back.

## 2024-07-04 NOTE — Telephone Encounter (Signed)
 Spoke to pt and went over the form.   Pt states he did not hear from his insurance regarding to this but he is okay for us  to fill it out and sent it to them if we would like to. He said back pain just happened this year and is seeing ortho in February.   Form is filled with pencil. Placed in provider's red folder.

## 2024-07-30 ENCOUNTER — Ambulatory Visit: Admitting: Family Medicine

## 2024-08-01 ENCOUNTER — Ambulatory Visit: Admitting: Family Medicine
